# Patient Record
Sex: Male | Born: 1955
Health system: Southern US, Community
[De-identification: ages and names within clinical notes are randomized; demographics above are authoritative.]

## PROBLEM LIST (undated history)

## (undated) DIAGNOSIS — I2510 Atherosclerotic heart disease of native coronary artery without angina pectoris: Secondary | ICD-10-CM

## (undated) DIAGNOSIS — I639 Cerebral infarction, unspecified: Secondary | ICD-10-CM

## (undated) DIAGNOSIS — I509 Heart failure, unspecified: Secondary | ICD-10-CM

---

## 2015-10-18 ENCOUNTER — Encounter (HOSPITAL_COMMUNITY): Payer: Self-pay | Admitting: Emergency Medicine

## 2015-10-18 DIAGNOSIS — R531 Weakness: Secondary | ICD-10-CM | POA: Insufficient documentation

## 2015-10-18 DIAGNOSIS — I509 Heart failure, unspecified: Secondary | ICD-10-CM | POA: Insufficient documentation

## 2015-10-18 DIAGNOSIS — I6349 Cerebral infarction due to embolism of other cerebral artery: Secondary | ICD-10-CM | POA: Diagnosis not present

## 2015-10-18 DIAGNOSIS — Z09 Encounter for follow-up examination after completed treatment for conditions other than malignant neoplasm: Secondary | ICD-10-CM | POA: Diagnosis present

## 2015-10-18 DIAGNOSIS — I2510 Atherosclerotic heart disease of native coronary artery without angina pectoris: Secondary | ICD-10-CM | POA: Diagnosis not present

## 2015-10-18 DIAGNOSIS — F17200 Nicotine dependence, unspecified, uncomplicated: Secondary | ICD-10-CM | POA: Insufficient documentation

## 2015-10-18 DIAGNOSIS — R4789 Other speech disturbances: Secondary | ICD-10-CM | POA: Insufficient documentation

## 2015-10-18 NOTE — ED Notes (Signed)
Pt. was just [facility] [place] [facility] , family request follow -up check up before going home , pt. was hospitalized at [place] for 2 weeks due to stroke .

## 2015-10-19 ENCOUNTER — Emergency Department (HOSPITAL_COMMUNITY)
Admission: EM | Admit: 2015-10-19 | Discharge: 2015-10-19 | Disposition: A | Discharge status: Home / Self Care | Payer: Medicaid Other | Attending: Emergency Medicine | Admitting: Emergency Medicine

## 2015-10-19 DIAGNOSIS — I6350 Cerebral infarction due to unspecified occlusion or stenosis of unspecified cerebral artery: Secondary | ICD-10-CM

## 2015-10-19 HISTORY — DX: Cerebral infarction, unspecified: I63.9

## 2015-10-19 HISTORY — DX: Heart failure, unspecified: I50.9

## 2015-10-19 HISTORY — DX: Atherosclerotic heart disease of native coronary artery without angina pectoris: I25.10

## 2015-10-19 LAB — COMPREHENSIVE METABOLIC PANEL
ALBUMIN: 2.9 g/dL — AB (ref 3.5–5.0)
ALT: 38 U/L (ref 17–63)
ANION GAP: 9 (ref 5–15)
AST: 45 U/L — AB (ref 15–41)
Alkaline Phosphatase: 128 U/L — ABNORMAL HIGH (ref 38–126)
BILIRUBIN TOTAL: 0.6 mg/dL (ref 0.3–1.2)
BUN: 22 mg/dL — AB (ref 6–20)
CHLORIDE: 105 mmol/L (ref 101–111)
CO2: 25 mmol/L (ref 22–32)
Calcium: 9.6 mg/dL (ref 8.9–10.3)
Creatinine, Ser: 1.41 mg/dL — ABNORMAL HIGH (ref 0.61–1.24)
GFR calc Af Amer: >60 mL/min (ref >60)
GFR calc non Af Amer: 54 mL/min — ABNORMAL LOW (ref >60)
GLUCOSE: 146 mg/dL — AB (ref 65–99)
POTASSIUM: 3.8 mmol/L (ref 3.5–5.1)
SODIUM: 139 mmol/L (ref 135–145)
Total Protein: 8.7 g/dL — ABNORMAL HIGH (ref 6.5–8.1)

## 2015-10-19 LAB — CBC WITH DIFFERENTIAL/PLATELET
BASOS PCT: 0 %
Basophils Absolute: 0 10*3/uL (ref 0.0–0.1)
EOS ABS: 0.5 10*3/uL (ref 0.0–0.7)
EOS PCT: 5 %
HCT: 40.2 % (ref 39.0–52.0)
HEMOGLOBIN: 13.1 g/dL (ref 13.0–17.0)
Lymphocytes Relative: 20 %
Lymphs Abs: 2.1 10*3/uL (ref 0.7–4.0)
MCH: 30.7 pg (ref 26.0–34.0)
MCHC: 32.6 g/dL (ref 30.0–36.0)
MCV: 94.1 fL (ref 78.0–100.0)
MONOS PCT: 7 %
Monocytes Absolute: 0.8 10*3/uL (ref 0.1–1.0)
NEUTROS PCT: 68 %
Neutro Abs: 7.5 10*3/uL (ref 1.7–7.7)
PLATELETS: 478 10*3/uL — AB (ref 150–400)
RBC: 4.27 MIL/uL (ref 4.22–5.81)
RDW: 13.4 % (ref 11.5–15.5)
WBC: 10.9 10*3/uL — ABNORMAL HIGH (ref 4.0–10.5)

## 2015-10-19 NOTE — ED Provider Notes (Signed)
CSN: [id number]     Arrival date & time 10/18/15  2312 History  By signing my name below, I, Doreatha Martin, attest that this documentation has been prepared under the direction and in the presence of Francisco Hong, MD. Electronically Signed: Doreatha Martin, ED Scribe. 10/19/2015. 12:40 AM.    Chief Complaint  Patient presents with  . Follow-up   The history is provided by the patient and a relative. No language interpreter was used.    HPI Comments: Francisco Obrien is a 59 y.o. male with h/o stroke, CAD, CHF, 2 cardiac stents who presents to the Emergency Department requesting a follow up s/p discharge from [facility] in [place] today. Pt was hospitalized for 2 weeks due to a stroke. Pt was in [place] visiting family. Son states he was hospitalized for CHF for 4 days, went home and was readmitted the next day for a non bleeding stroke. He presented with right sided weakness and dysarthria. Pt was discharged on blood thinners. Son notes the pt was completely baseline before the stroke. Son states that he was told the pts condition should continue to improve with physical therapy. Pt is not insured due to a recent move to [place] a few months ago. Family denies any changes in mental status or condition since discharge from the hospital.   Past Medical History  Diagnosis Date  . Stroke (HCC)   . Coronary artery disease   . CHF (congestive heart failure) (HCC)    No past surgical history on file. No family history on file. Social History  Substance Use Topics  . Smoking status: Current Every Day Smoker  . Smokeless tobacco: Not on file  . Alcohol Use: Yes    Review of Systems  Neurological: Positive for speech difficulty ( s/p stroke) and weakness ( right sided weakness s/p stroke).  All other systems reviewed and are negative.  Allergies  Review of patient's allergies indicates no known allergies.  Home Medications   Prior to Admission medications   Not on File   BP 106/81 mmHg  Pulse  70  Temp(Src) 97.3 F (36.3 C) (Oral)  Resp 16  SpO2 100% Physical Exam  Constitutional: He appears well-developed and well-nourished. No distress.  HENT:  Head: Normocephalic and atraumatic.  Mouth/Throat: Oropharynx is clear and moist. No oropharyngeal exudate.  Eyes: Conjunctivae and EOM are normal. Pupils are equal, round, and reactive to light. Right eye exhibits no discharge. Left eye exhibits no discharge. No scleral icterus.  Neck: Normal range of motion. Neck supple. No JVD present. No thyromegaly present.  Cardiovascular: Normal rate, regular rhythm, normal heart sounds and intact distal pulses.  Exam reveals no gallop and no friction rub.   No murmur heard. There is an S4  Pulmonary/Chest: Effort normal and breath sounds normal. No respiratory distress. He has no wheezes. He has no rales.  Abdominal: Soft. Bowel sounds are normal. He exhibits no distension and no mass. There is no tenderness.  Musculoskeletal: Normal range of motion. He exhibits no edema or tenderness.  Lymphadenopathy:    He has no cervical adenopathy.  Neurological: He is alert. Coordination normal.  Dense right sided weakness of arm and leg. Expressive aphasia.   Skin: Skin is warm and dry. No rash noted. He is not diaphoretic. No erythema.  Psychiatric: He has a normal mood and affect. His behavior is normal.  Nursing note and vitals reviewed.  ED Course  Procedures (including critical care time) DIAGNOSTIC STUDIES: Oxygen Saturation is 100% on RA,  normal by my interpretation.    COORDINATION OF CARE: 12:38 AM Discussed treatment plan with pt at bedside and pt agreed to plan.   Labs Review Labs Reviewed  CBC WITH DIFFERENTIAL/PLATELET - Abnormal; Notable for the following:    WBC 10.9 (*)    Platelets 478 (*)    All other components within normal limits  COMPREHENSIVE METABOLIC PANEL - Abnormal; Notable for the following:    Glucose, Bld 146 (*)    BUN 22 (*)    Creatinine, Ser 1.41 (*)     Total Protein 8.7 (*)    Albumin 2.9 (*)    AST 45 (*)    Alkaline Phosphatase 128 (*)    GFR calc non Af Amer 54 (*)    All other components within normal limits   I have personally reviewed and evaluated these lab results as part of my medical decision-making.  MDM   Final diagnoses:  Cerebrovascular accident (CVA) due to occlusion of cerebral artery (HCC)    The pt has a recent dense stroke - he is currently living with family who has recently moved her and he has now moved in with them - the pt has no resources in the community and no health insurance - the son and his significant other will care for Francisco Obrien until Monday and I have ordered a face to face consult for him with SW and PT / OT etc.  They are in agreement with the plan - I have written a Rx for a bedside commode.  Meds given in ED:  Medications - No data to display  There are no discharge medications for this patient.   I personally performed the services described in this documentation, which was scribed in my presence. The recorded information has been reviewed and is accurate.       Francisco HongBrian Cesareo Vickrey, MD 10/20/15 [phone]

## 2015-10-19 NOTE — ED Notes (Signed)
Pt had an unwitnessed fall. Housekeeper walking by found pt lying on floor. Pt unsure why he fell or where he was going. Fall risk in place. Pt pending discharge. Pt denies pain or injury. Charge and EDP made aware

## 2015-10-19 NOTE — Discharge Instructions (Signed)
°Emergency Department Resource Guide °1) Find a Doctor and Pay Out of Pocket °Although you won't have to find out who is covered by your insurance plan, it is a good idea to ask around and get recommendations. You will then need to call the office and see if the doctor you have chosen will accept you as a new patient and what types of options they offer for patients who are self-pay. Some doctors offer discounts or will set up payment plans for their patients who do not have insurance, but you will need to ask so you aren't surprised when you get to your appointment. ° °2) Contact Your Local Health Department °Not all health departments have doctors that can see patients for sick visits, but many do, so it is worth a call to see if yours does. If you don't know where your local health department is, you can check in your phone book. The CDC also has a tool to help you locate your state's health department, and many state websites also have listings of all of their local health departments. ° °3) Find a Walk-in Clinic °If your illness is not likely to be very severe or complicated, you may want to try a walk in clinic. These are popping up all over the country in pharmacies, drugstores, and shopping centers. They're usually staffed by nurse practitioners or physician assistants that have been trained to treat common illnesses and complaints. They're usually fairly quick and inexpensive. However, if you have serious medical issues or chronic medical problems, these are probably not your best option. ° °No Primary Care Doctor: °- Call Health Connect at  832-8000 - they can help you locate a primary care doctor that  accepts your insurance, provides certain services, etc. °- Physician Referral Service- 1-800-533-3463 ° °Chronic Pain Problems: °Organization         Address  Phone   Notes  °[facility]  (336) 297-2271 Patients need to be referred by their primary care doctor.  ° °Medication  Assistance: °Organization         Address  Phone   Notes  °[place] [facility] [address] °[place], [place] [address] (336) 641-8030 --Must be a resident of [place] °-- Must have NO insurance coverage whatsoever (no Medicaid/ Medicare, etc.) °-- The pt. MUST have a primary care doctor that directs their care regularly and follows them in the community °  °[facility]  (866) 331-1348   °[employer]  (888) 892-1162   ° °Agencies that provide inexpensive medical care: °Organization         Address  Phone   Notes  °[facility]  (336) 832-8035   °[facility]    (336) 832-7272   °[facility] [address] °[place], [place] [address] (336) 832-4777   °[facility] [address], °[place] (336) 271-4999   °[facility]    (336) 373-0678   °[facility]    (336) 272-1050   °[facility] ° [address], [place] Phone:  (336) 832-4444, Fax:  (336) 832-4440 Hours of Operation:  9 am - 6 pm, M-F.  Also accepts Medicaid/Medicare and self-pay.  °[facility] ° [address],  Phone: (336) 832-3150, Fax: (336) 832-3151. Hours of Operation:  8:30 am - 5:30 pm, M-F.  Also accepts Medicaid and self-pay.  °[facility] [place] [address], [place] Phone: (336) 878-6027   °[facility] [address], [place], [place] (336)723-1848, Ext. 123 Mondays & Thursdays: 7-9 AM.  First 15 patients are seen on a first come, first serve basis. °  ° °Medicaid-accepting [place] Providers: ° °Organization         Address  Phone   Notes  °[facility] [address], [place] (336) 641-2100 Also accepts self-pay patients.  °[facility] [address], [place] ° (336) 856-9996   °[facility] [address], [place]  (336) 288-8857   °[facility] [address] [place] [address], [place] (336) 299-7000   °Veita Bland [address], [place]  ° (336) 373-1557 Only accepts [place] Access Medicaid patients after they have their name applied to their card.  ° °Self-Pay (no insurance) in [place]: ° °Organization         Address  Phone   Notes  °Sickle Cell Patients, [facility] [address], [place] (336) 832-1970   °[facility] [address], [place] (336) 832-4400   °[facility] [place] ° [address],  (336) 992-4800   °[facility]/Dr. Osei-Bonsu ° [address] [place] [address], [place] or [address], [place] (336) 841-8500 Phone number for both [place] and [place] locations is the same.  °[facility] [address], [place] (336) 299-0000   °[facility] [address] [place] [address], [place] or [address] (336) 852-7530 °(336) 878-2260   °[facility] [address], [place] (336) 350-1642, phone; (336) 294-5005, fax Sees patients 1st and 3rd Saturday of every month.  Must not qualify for public or private insurance (i.e. Medicaid, Medicare, [place] Health Choice, Veterans' Benefits) • Household income should be no more than 200% of the poverty level •The clinic cannot treat you if you are pregnant or think you are pregnant • Sexually transmitted diseases are not treated at the clinic.  ° ° °Dental Care: °Organization         Address  Phone  Notes  °[place] [facility] [address], [place] (336) 641-6152 Accepts children up to age 21 who are enrolled in Medicaid or [place] Health Choice; pregnant women with a Medicaid card; and children who have applied for Medicaid or [place] Health Choice, but were declined, whose parents can pay a reduced fee at time of service.  °[place]  [facility] [place]  [address], [place] (336) 641-7733 Accepts children up to age 21 who are enrolled in Medicaid or [place] Health Choice; pregnant women with a Medicaid card; and children who have applied for Medicaid or [place] Health Choice, but were declined, whose parents can pay a reduced fee at time of service.  °[facility] ° [address], [place] (336) 641-4533 Patients are seen by appointment only. Walk-ins are not accepted. [facility] will see patients 18 years of age and older. °Monday - Tuesday (8am-5pm) °Most Wednesdays (8:30-5pm) °$30 per visit, cash only  °[facility] ° [address], [place] (336) 641-4533 Patients are seen by appointment only. Walk-ins are not accepted. [facility] will see patients 18 years of age and older. °One   Wednesday Evening (Monthly: Volunteer Based).  $30 per visit, cash only  °[facility]  (919) 537-3737 for adults; Children under age 4, call Graduate Pediatric Dentistry at (919) 537-3956. Children aged 4-14, please call (919) 537-3737 to request a pediatric application. ° Dental services are provided in all areas of dental care including fillings, crowns and bridges, complete and partial dentures, implants, gum treatment, root canals, and extractions. Preventive care is also provided. Treatment is provided to both adults and children. °Patients are selected via a lottery and there is often a waiting list. °  °[facility] [address], °[place] ° (336) 763-8833 [email or web address] °  °[facility] [address], [place], [place] (336)723-1848, Ext. 123 Second and Fourth Thursday of each month, opens at 6:30 AM; Clinic ends at 9 AM.  Patients are seen on a first-come first-served basis, and a limited number are seen during each clinic.  ° °[facility] ° [address], [place], [place] (336) 723-7904    Eligibility Requirements °You must have lived in [place], [place], or [place] counties for at least the last three months. °  You cannot be eligible for state or federal sponsored healthcare insurance, including Veterans Administration, Medicaid, or Medicare. °  You generally cannot be eligible for healthcare insurance through your employer.  °  How to apply: °Eligibility screenings are held every Tuesday and Wednesday afternoon from 1:00 pm until 4:00 pm. You do not need an appointment for the interview!  °[facility] [address], [place], [place] 336-631-2330   °[place] [facility]  336-342-8273   °[facility]  336-703-3100   °[facility]  336-570-6415   ° °Behavioral Health Resources in the Community: °Intensive Outpatient Programs °Organization         Address  Phone  Notes  °[facility] [address], [place], [place] 336-878-6098   °[facility] [address], [place], [place] 336-832-9800   °[facility] [address], [place], [place] ° 336-882-2125   °[facility] [address],  °[place], [place] 1-800-853-5163 or 336-641-4981   °Substance Abuse Resources °Organization         Address  Phone  Notes  °[facility]  336-882-2125   °[facility]  336-784-9470   °[facility]  336-285-9073   °[facility]  336-845-3988   °Residential & Outpatient Substance Abuse Program  1-800-659-3381   °Psychological Services °Organization         Address  Phone  Notes  °[facility]  336- 832-9600   °[facility]  336- 378-7881   °[facility] [address], [place] 1-800-853-5163 or 336-641-4981   ° °Mobile Crisis Teams °Organization         Address  Phone  Notes  °[facility], Mobile Crisis Care Unit  1-877-626-1772   °Assertive °[facility] ° [address]  [place], [place] 336-834-9664   °Sharon DeEsch [address] °[place] 336-554-5454   ° °Self-Help/Support Groups °Organization         Address  Phone             Notes  °[facility] - variety of support groups  336- 373-1402 Call for more information  °Narcotics Anonymous (NA), [facility] [address], °[place]   2 meetings at this location  ° °  Residential Treatment Programs °Organization         Address  Phone  Notes  °[facility] [address],    °[place]  1-866-801-8205   °[facility] ° [address], [place], [place] 704-293-8524   °[facility] [address], [place] 336-845-3988 Admissions: 8am-3pm M-F  °[facility] [address]    °[place], [place] 336-841-1104   °[facility] [address], [place], [place] 336-379-7146   °[facility] [address]  °[place], [place] 336-285-9073   °[facility] Intensive Outpatient [address], [place], [place] 336-852-3033   °[facility] [address]  °[place], [place] 1-877-615-2722 or 336-784-9470   °[facility] [address] [place], [place] 336-227-7417 Accepts Medicaid  °[facility] [address]  °[place] 1-800-659-3381 Substance Abuse/Addiction Treatment  ° °[place] Behavioral Health Resources °Organization         Address  Phone  Notes  °[facility]  (888) 581-9988   °Julie Brannon, PhD [address] [place], [place]   (336) 349-5553 or (336) 951-0000   °[facility]   [address] °[place], [place] (336) 349-4454   °[facility] [address], [place], [place] (336) 342-8316 Insurance/Medicaid/sponsorship through [facility]  °[facility] [address]                                    [place], [place] (336) 342-8316 Therapy/tele-psych/case    °[facility] [address]  ° [place], [place] (336) 349-2233    °Dr. Arfeen  (336) 349-4544   °[facility] [place]  [employer] [place] [facility] 1) [address],  °2) [address], [place] °3)  [address], [place] (336) 349-3220 °(336) 342-7768 ° °(336) 342-8140   °[place] Child Abuse Hotline (336) 342-1394 or (336) 342-3537 (After Hours)    ° ° °

## 2015-10-19 NOTE — ED Notes (Signed)
Pt unable to e-sign, pt verbalized understanding DC instructions and prescription for bedside commode.

## 2015-10-19 NOTE — ED Notes (Signed)
Upon entering room to DC pt, was being pulled up in bed by GrenadaBrittany RN, Manuela Schwartzuth RN, Anette RiedelNoah EMT. Stated that pt had been found on floor, EDP was aware before DC.

## 2015-10-22 ENCOUNTER — Emergency Department (HOSPITAL_COMMUNITY)
Admission: EM | Admit: 2015-10-22 | Discharge: 2015-10-26 | Disposition: A | Discharge status: Home / Self Care | Payer: Medicaid Other | Attending: Emergency Medicine | Admitting: Emergency Medicine

## 2015-10-22 ENCOUNTER — Emergency Department (HOSPITAL_COMMUNITY): Payer: Medicaid Other

## 2015-10-22 ENCOUNTER — Encounter (HOSPITAL_COMMUNITY): Payer: Self-pay | Admitting: Emergency Medicine

## 2015-10-22 DIAGNOSIS — I2510 Atherosclerotic heart disease of native coronary artery without angina pectoris: Secondary | ICD-10-CM | POA: Insufficient documentation

## 2015-10-22 DIAGNOSIS — F17200 Nicotine dependence, unspecified, uncomplicated: Secondary | ICD-10-CM | POA: Insufficient documentation

## 2015-10-22 DIAGNOSIS — I639 Cerebral infarction, unspecified: Secondary | ICD-10-CM | POA: Insufficient documentation

## 2015-10-22 DIAGNOSIS — I509 Heart failure, unspecified: Secondary | ICD-10-CM | POA: Insufficient documentation

## 2015-10-22 LAB — PROTIME-INR
INR: 1.06 (ref 0.00–1.49)
Prothrombin Time: 14 seconds (ref 11.6–15.2)

## 2015-10-22 LAB — COMPREHENSIVE METABOLIC PANEL
ALBUMIN: 3 g/dL — AB (ref 3.5–5.0)
ALK PHOS: 118 U/L (ref 38–126)
ALT: 34 U/L (ref 17–63)
AST: 55 U/L — AB (ref 15–41)
Anion gap: 6 (ref 5–15)
BUN: 21 mg/dL — AB (ref 6–20)
CALCIUM: 9.3 mg/dL (ref 8.9–10.3)
CO2: 24 mmol/L (ref 22–32)
CREATININE: 1.35 mg/dL — AB (ref 0.61–1.24)
Chloride: 107 mmol/L (ref 101–111)
GFR calc Af Amer: >60 mL/min (ref >60)
GFR, EST NON AFRICAN AMERICAN: 57 mL/min — AB (ref >60)
GLUCOSE: 98 mg/dL (ref 65–99)
Potassium: 5.5 mmol/L — ABNORMAL HIGH (ref 3.5–5.1)
Sodium: 137 mmol/L (ref 135–145)
TOTAL PROTEIN: 7.8 g/dL (ref 6.5–8.1)
Total Bilirubin: 1 mg/dL (ref 0.3–1.2)

## 2015-10-22 LAB — CBC
HEMATOCRIT: 40 % (ref 39.0–52.0)
HEMOGLOBIN: 13.3 g/dL (ref 13.0–17.0)
MCH: 31.4 pg (ref 26.0–34.0)
MCHC: 33.3 g/dL (ref 30.0–36.0)
MCV: 94.6 fL (ref 78.0–100.0)
Platelets: 418 10*3/uL — ABNORMAL HIGH (ref 150–400)
RBC: 4.23 MIL/uL (ref 4.22–5.81)
RDW: 13.5 % (ref 11.5–15.5)
WBC: 11 10*3/uL — AB (ref 4.0–10.5)

## 2015-10-22 LAB — I-STAT TROPONIN, ED: TROPONIN I, POC: 0.04 ng/mL (ref 0.00–0.08)

## 2015-10-22 LAB — APTT: aPTT: 30 seconds (ref 24–37)

## 2015-10-22 MED ORDER — HYDRALAZINE HCL 25 MG PO TABS
25.0000 mg | ORAL_TABLET | Freq: Three times a day (TID) | ORAL | Status: DC
  Administered 2015-10-22 – 2015-10-26 (×7): 25 mg via ORAL
  Filled 2015-10-22 (×18): qty 1

## 2015-10-22 MED ORDER — ASPIRIN 81 MG PO CHEW
324.0000 mg | CHEWABLE_TABLET | Freq: Once | ORAL | Status: AC
  Administered 2015-10-22: 324 mg via ORAL
  Filled 2015-10-22: qty 4

## 2015-10-22 MED ORDER — HEPARIN SODIUM (PORCINE) 5000 UNIT/ML IJ SOLN: 60.0000 [IU]/kg | INTRAMUSCULAR | Status: DC

## 2015-10-22 MED ORDER — ASPIRIN EC 81 MG PO TBEC
81.0000 mg | DELAYED_RELEASE_TABLET | Freq: Every day | ORAL | Status: DC
  Administered 2015-10-22 – 2015-10-26 (×5): 81 mg via ORAL
  Filled 2015-10-22 (×5): qty 1

## 2015-10-22 MED ORDER — PANTOPRAZOLE SODIUM 40 MG PO TBEC
40.0000 mg | DELAYED_RELEASE_TABLET | Freq: Every day | ORAL | Status: DC
  Administered 2015-10-22 – 2015-10-26 (×5): 40 mg via ORAL
  Filled 2015-10-22 (×5): qty 1

## 2015-10-22 MED ORDER — FUROSEMIDE 20 MG PO TABS
40.0000 mg | ORAL_TABLET | Freq: Every day | ORAL | Status: DC
  Administered 2015-10-22 – 2015-10-26 (×5): 40 mg via ORAL
  Filled 2015-10-22 (×5): qty 2

## 2015-10-22 MED ORDER — TAMSULOSIN HCL 0.4 MG PO CAPS
0.4000 mg | ORAL_CAPSULE | Freq: Every day | ORAL | Status: DC
  Administered 2015-10-22 – 2015-10-26 (×5): 0.4 mg via ORAL
  Filled 2015-10-22 (×6): qty 1

## 2015-10-22 MED ORDER — ATORVASTATIN CALCIUM 40 MG PO TABS
40.0000 mg | ORAL_TABLET | Freq: Every day | ORAL | Status: DC
  Administered 2015-10-22: 40 mg via ORAL
  Filled 2015-10-22 (×5): qty 1

## 2015-10-22 MED ORDER — CARVEDILOL 6.25 MG PO TABS
6.2500 mg | ORAL_TABLET | Freq: Two times a day (BID) | ORAL | Status: DC
  Administered 2015-10-23: 6.25 mg via ORAL
  Filled 2015-10-22 (×4): qty 1

## 2015-10-22 MED ORDER — AMLODIPINE BESYLATE 5 MG PO TABS
10.0000 mg | ORAL_TABLET | Freq: Every day | ORAL | Status: DC
  Administered 2015-10-22 – 2015-10-26 (×5): 10 mg via ORAL
  Filled 2015-10-22 (×6): qty 2

## 2015-10-22 MED ORDER — HEPARIN SODIUM (PORCINE) 5000 UNIT/ML IJ SOLN
60.0000 [IU]/kg | INTRAMUSCULAR | Status: DC
  Filled 2015-10-22: qty 1

## 2015-10-22 MED ORDER — CLOPIDOGREL BISULFATE 75 MG PO TABS
75.0000 mg | ORAL_TABLET | Freq: Every day | ORAL | Status: DC
  Administered 2015-10-22 – 2015-10-26 (×5): 75 mg via ORAL
  Filled 2015-10-22 (×5): qty 1

## 2015-10-22 MED ORDER — SODIUM CHLORIDE 0.9 % IV SOLN
INTRAVENOUS | Status: DC
  Administered 2015-10-22 – 2015-10-23 (×2): via INTRAVENOUS

## 2015-10-22 NOTE — Care Management (Signed)
CM [facility] with [facility], [facility]  CM discussed with patient, he has dysarthria, CM has difficulty understanding patient. CM attempted to contact son Quran with discharge plan, no answer left message on VM concerning discharge. CM discussed with ED CSW.  We will continue to locate family to discharge patient home with [facility]AR.

## 2015-10-22 NOTE — Progress Notes (Signed)
[employer] dispatched to pt's home to make contact with his son.

## 2015-10-22 NOTE — Care Management Note (Signed)
Case Management Note  Patient Details  Name: Francisco Obrien MRN: [id number] Date of Birth: 01-Jan-1956  Subjective/Objective:                  59 y.o. male with h/o stroke, CAD, CHF, 2 cardiac stents who presents to the Emergency Department requesting a follow up s/p stroke. Pt was hospitalized for 2 weeks due to a stroke; [place] family who feels they can no longer care for pt in home due to weakness.// Home with family.  Action/Plan: Follow for disposition needs.   Expected Discharge Date:       10/22/15           Expected Discharge Plan:  Skilled Nursing Facility  In-House Referral:  Clinical Social Work  Discharge planning Services  CM Consult  Post Acute Care Choice:    Choice offered to:  Patient, Adult Children  DME Arranged:    DME Agency:     HH Arranged:    HH Agency:     Status of Service:  In process, will continue to follow  Medicare Important Message Given:    Date Medicare IM Given:    Medicare IM give by:    Date Additional Medicare IM Given:    Additional Medicare Important Message give by:     If discussed at Long Length of Stay Meetings, dates discussed:    Additional Comments: NCM  And Social Work consulted regarding disposition needs :HH vs SNF placement.  RN talked with family who states they can no longer care for pt in home- SNF placement optimal. Oletta CohnWood, Medina Degraffenreid, RN 10/22/2015, 2:22 PM

## 2015-10-22 NOTE — ED Notes (Signed)
zoll pads in place.  

## 2015-10-22 NOTE — ED Notes (Signed)
SW at the bedside.

## 2015-10-22 NOTE — Discharge Planning (Signed)
NCM contacted [facility] to see if pt would qualify for indigent services.  [facility] will review pt.

## 2015-10-22 NOTE — ED Notes (Signed)
[facility] Per EMS, pt was at home and started having SOB around 0030. EMS arrived and room air sats were at 88%, increasing to 94% on 4L. Pt had a CVA last Tuesday and was discharged this past weekend. Pt has baseline right side deficits. Last BP was 140/100, HR 78. EMS placed a 20g in the left hand.

## 2015-10-22 NOTE — ED Provider Notes (Signed)
CSN: [id number]     Arrival date & time 10/22/15  0130 History   By signing my name below, I, Freida Busman, attest that this documentation has been prepared under the direction and in the presence of Tomasita Crumble, MD . Electronically Signed: Freida Busman, Scribe. 10/22/2015. 2:43 AM.   Chief Complaint  Patient presents with  . Shortness of Breath   LEVEL 5 CAVEAT DUE TO residual slurred speech due to stroke   The history is provided by the patient. No language interpreter was used.    HPI Comments:  Francisco Obrien is a 59 y.o. male with a history of CVA, CAD, and CHF, who presents to the Emergency Department via EMS complaining of SOB. He denies use of home O2. He also denies pain at this time and cough. Pt was evaluated in the ED on 10/19/2015 for unwitnessed fall following CVA 2 weeks prior. He was admitted in [place] for his CVA, he is in [place] visiting family. Pt has had baseline right sided deficits since.  Past Medical History  Diagnosis Date  . Stroke (HCC)   . Coronary artery disease   . CHF (congestive heart failure) (HCC)    History reviewed. No pertinent past surgical history. No family history on file. Social History  Substance Use Topics  . Smoking status: Current Every Day Smoker  . Smokeless tobacco: None  . Alcohol Use: Yes    Review of Systems  Unable to perform ROS: Other (Slurred speech )    Allergies  Review of patient's allergies indicates no known allergies.  Home Medications   Prior to Admission medications   Not on File   BP 127/92 mmHg  Pulse 78  Temp(Src) 97.4 F (36.3 C) (Oral)  Resp 14  Ht  (1.803 m)  Wt 140 lb (63.504 kg)  BMI 19.53 kg/m2  SpO2 96% Physical Exam  Constitutional: He is oriented to person, place, and time. Vital signs are normal. He appears well-developed.  Non-toxic appearance. He does not appear ill. No distress.  HENT:  Head: Normocephalic and atraumatic.  Nose: Nose normal.  Mouth/Throat: Oropharynx is clear and  moist. No oropharyngeal exudate.   in place   Eyes: Conjunctivae and EOM are normal. Pupils are equal, round, and reactive to light. No scleral icterus.  Neck: Normal range of motion. Neck supple. No tracheal deviation, no edema, no erythema and normal range of motion present. No thyroid mass and no thyromegaly present.  Cardiovascular: Normal rate, regular rhythm, S1 normal, S2 normal, normal heart sounds, intact distal pulses and normal pulses.  Exam reveals no gallop and no friction rub.   No murmur heard. Pulmonary/Chest: Effort normal and breath sounds normal. No respiratory distress. He has no wheezes. He has no rhonchi. He has no rales.  Abdominal: Soft. Normal appearance and bowel sounds are normal. He exhibits no distension, no ascites and no mass. There is no hepatosplenomegaly. There is no tenderness. There is no rebound, no guarding and no CVA tenderness.  Musculoskeletal: Normal range of motion. He exhibits no edema or tenderness.  Lymphadenopathy:    He has no cervical adenopathy.  Neurological: He is alert and oriented to person, place, and time. He has normal strength. No cranial nerve deficit or sensory deficit.  0/5strength  RUE and RLE  5/5 strength LLE and LUE Slurred speech  Skin: Skin is warm, dry and intact. No petechiae and no rash noted. He is not diaphoretic. No erythema. No pallor.  Psychiatric: He has a normal  mood and affect. His behavior is normal. Judgment normal.  Nursing note and vitals reviewed.   ED Course  Procedures   DIAGNOSTIC STUDIES:  Oxygen Saturation is 100% on 4L [place], normal by my interpretation.     Labs Review Labs Reviewed  CBC - Abnormal; Notable for the following:    WBC 11.0 (*)    Platelets 418 (*)    All other components within normal limits  COMPREHENSIVE METABOLIC PANEL - Abnormal; Notable for the following:    Potassium 5.5 (*)    BUN 21 (*)    Creatinine, Ser 1.35 (*)    Albumin 3.0 (*)    AST 55 (*)    GFR calc non Af  Amer 57 (*)    All other components within normal limits  APTT  PROTIME-INR  Rosezena SensorI-STAT TROPOININ, ED    Imaging Review Dg Chest Port 1 View  10/22/2015  CLINICAL DATA:  59 year old male with shortness of breath EXAM: PORTABLE CHEST 1 VIEW COMPARISON:  None. FINDINGS: Single portable view of the chest demonstrate clear lungs with sharp costophrenic angles. No pneumothorax but top-normal cardiac silhouette. The osseous structures appear unremarkable. IMPRESSION: No active disease. Electronically Signed   By: Elgie CollardArash  Radparvar M.D.   On: 10/22/2015 02:57   I have personally reviewed and evaluated these images and lab results as part of my medical decision-making.   EKG Interpretation   Date/Time:  Tuesday October 22 2015 01:37:50 EST Ventricular Rate:  87 PR Interval:  128 QRS Duration: 111 QT Interval:  407 QTC Calculation: 490 R Axis:   84 Text Interpretation:  Sinus rhythm STD infeior leads STE in V2-V4 No old  tracing to compare Confirmed by Erroll Lunani, Jhonny Calixto Ayokunle [phone]([id number]) on 10/22/2015  2:32:06 AM      MDM   Final diagnoses:  None   Patient presents to the emergency department for evaluation of shortness of breath. He is unable to give a solid history due to his slurred speech from stroke. He denies chest pain to me however his EKG is concerning for possible STEMI. His ST elevations in V2 through V4 and ST depressions in the inferior leads. Code STEMI was activated, the fellow is evaluated the patient, Denyse Dagomer code STEMI has been canceled. Her troponin is negative as well. I spoke with the son who came to the room, he states it isn't overwhelming trying to help the patient after the stroke. He is looking for social work consult for rehabilitation.  Will retain the patient to the morning time worse as work and be consulted for disposition.    I personally performed the services described in this documentation, which was scribed in my presence. The recorded information has been  reviewed and is accurate.      Tomasita CrumbleAdeleke Tiaja Hagan, MD 10/22/15 [phone]

## 2015-10-22 NOTE — Progress Notes (Signed)
CSW has attempted to contact pt's son re: pt's d/c.  Await return call.

## 2015-10-22 NOTE — Progress Notes (Addendum)
Spoke with pt's son via phone.  Explained that pt needs to have means to pay for SNF care or apply for Medicaid/disability.  [facility] but is not exactly sure of the status of pt's application.  Son denies applying for Medicaid.  Pt's son encouraged to f/u with Medicaid ASAP so that he could be placed in a facility. [facility]

## 2015-10-22 NOTE — ED Notes (Signed)
Received pt from Pod B, pt awaiting Social Work Consult.  Pt is alert, oriented with expressive aphasia noted, R sided weakness - pt reports from previous stroke x 1 week ago.  Son at bedside.  Slip resistant socks and warm blankets provided.

## 2015-10-22 NOTE — ED Notes (Signed)
Pt refusing to have his BP medication at this time and refusing to have home care nurse set up by SW. Burna MortimerWanda, CM to talk to son about pt care and pt to be dc home by PTAR.

## 2015-10-22 NOTE — ED Notes (Signed)
Portable x-ray at the bedside.  

## 2015-10-22 NOTE — ED Notes (Signed)
Code stemi cancelled by cardiologist. Dr. Malena CatholicAmbrosia.

## 2015-10-22 NOTE — ED Notes (Signed)
Phlebotomy at the bedside  

## 2015-10-22 NOTE — ED Notes (Addendum)
Please contact son, [name], at following phone numbers w/ updates:  Home - [phone] Cell - [phone]

## 2015-10-23 LAB — I-STAT TROPONIN, ED: Troponin i, poc: 0.05 ng/mL (ref 0.00–0.08)

## 2015-10-23 MED ORDER — ACETAMINOPHEN 325 MG PO TABS: 650.0000 mg | ORAL_TABLET | Freq: Four times a day (QID) | ORAL | Status: DC | PRN

## 2015-10-23 NOTE — ED Notes (Signed)
EKG given to Dr. Bebe ShaggyWickline, due to pulse rate reading in the mid 30's and complaint of shortness of breath

## 2015-10-23 NOTE — ED Notes (Signed)
Pt's son reported to RN that Pt began to c/o chest pain. Obtained ECG, notified EDP, and assessed pt. During assessment pain report seemed transient x2. No ECG changes noted.

## 2015-10-23 NOTE — ED Provider Notes (Signed)
Nurse called and stated pt was bradycardic On my eval in room, HR 80-90s He is awake/alert Will hold his B-blocker  ED ECG REPORT   Date: 10/23/2015 1629  Rate: 76  Rhythm: normal sinus rhythm  QRS Axis: normal  Intervals: normal  ST/T Wave abnormalities: nonspecific ST changes  Conduction Disutrbances:LVH noted  Narrative Interpretation:   Old EKG Reviewed: unchanged  I have personally reviewed the EKG tracing and agree with the computerized printout as noted.   Zadie Rhineonald Paulette [place], MD 10/23/15 [phone]

## 2015-10-23 NOTE — ED Notes (Signed)
Holding Norvasc - POD E pyxis did not have the RX.  Messaged pharmacy to have them send to me.

## 2015-10-23 NOTE — NC FL2 (Signed)
[place] MEDICAID FL2 LEVEL OF CARE SCREENING TOOL     IDENTIFICATION  Patient Name: Francisco Obrien Birthdate: [birth year] Sex: male Admission Date (Current Location): 10/22/2015  [place] and [place] Number: [name] ([place])  ([id number]) Facility and Address:  The [place]. [facility], [address], [place], [place] [address]      Provider Number: [id number]  Attending Physician Name and Address:  Provider Not In System  Relative Name and Phone Number:       Current Level of Care: Hospital Recommended Level of Care: Skilled Nursing Facility Prior Approval Number:    Date Approved/Denied:   PASRR Number:    Discharge Plan: SNF    Current Diagnoses: There are no active problems to display for this patient.   Orientation ACTIVITIES/SOCIAL BLADDER RESPIRATION    Self, Situation, Place  Family supportive Incontinent Normal  BEHAVIORAL SYMPTOMS/MOOD NEUROLOGICAL BOWEL NUTRITION STATUS      Incontinent  (bed side swallow pending)  PHYSICIAN VISITS COMMUNICATION OF NEEDS Height & Weight Skin  30 days Verbally  (180.3 cm) 140 lbs. Normal          AMBULATORY STATUS RESPIRATION    Assist extensive Normal      Personal Care Assistance Level of Assistance  Bathing, Feeding, Dressing Bathing Assistance: Maximum assistance Feeding assistance: Limited assistance Dressing Assistance: Maximum assistance      Functional Limitations Info  Speech     Speech Info: Impaired       SPECIAL CARE FACTORS FREQUENCY  PT (By licensed PT), OT (By licensed OT)                   Additional Factors Info                  Current Medications (10/23/2015):  This is the current hospital active medication list Current Facility-Administered Medications  Medication Dose Route Frequency Provider Last Rate Last Dose  . 0.9 %  sodium chloride infusion   Intravenous Continuous Tomasita Crumble, MD 10 mL/hr at 10/22/15 0234    . amLODipine (NORVASC) tablet 10 mg  10  mg Oral Daily Gwyneth Sprout, MD   10 mg at 10/23/15 1057  . aspirin EC tablet 81 mg  81 mg Oral Daily Gwyneth Sprout, MD   81 mg at 10/23/15 0947  . atorvastatin (LIPITOR) tablet 40 mg  40 mg Oral QHS Gwyneth Sprout, MD   40 mg at 10/22/15 2121  . clopidogrel (PLAVIX) tablet 75 mg  75 mg Oral Daily Gwyneth Sprout, MD   75 mg at 10/23/15 0947  . furosemide (LASIX) tablet 40 mg  40 mg Oral Daily Gwyneth Sprout, MD   40 mg at 10/23/15 0947  . hydrALAZINE (APRESOLINE) tablet 25 mg  25 mg Oral TID Gwyneth Sprout, MD   25 mg at 10/23/15 0947  . pantoprazole (PROTONIX) EC tablet 40 mg  40 mg Oral Daily Gwyneth Sprout, MD   40 mg at 10/23/15 0947  . tamsulosin (FLOMAX) capsule 0.4 mg  0.4 mg Oral Daily Gwyneth Sprout, MD   0.4 mg at 10/23/15 7425   Current Outpatient Prescriptions  Medication Sig Dispense Refill  . amLODipine (NORVASC) 10 MG tablet Take 10 mg by mouth daily.    [name] aspirin 81 MG tablet Take 81 mg by mouth daily.    [name] atorvastatin (LIPITOR) 40 MG tablet Take 40 mg by mouth at bedtime.    . carvedilol (COREG) 6.25 MG tablet Take 6.25 mg by mouth 2 (two) times  daily with a meal.    . clopidogrel (PLAVIX) 75 MG tablet Take 75 mg by mouth daily.    . furosemide (LASIX) 40 MG tablet Take 40 mg by mouth daily.    . hydrALAZINE (APRESOLINE) 25 MG tablet Take 25 mg by mouth 3 (three) times daily.    . pantoprazole (PROTONIX) 40 MG tablet Take 40 mg by mouth daily.    . tamsulosin (FLOMAX) 0.4 MG CAPS capsule Take 0.4 mg by mouth daily.       Discharge Medications: Please see discharge summary for a list of discharge medications.  Relevant Imaging Results:  Relevant Lab Results:  Recent Labs    Additional Information    [name], LCSW

## 2015-10-23 NOTE — ED Notes (Signed)
Sharyl NimrodMeredith, daughter in law, called to check on patient.   She wants social work to call her back at [phone].

## 2015-10-23 NOTE — ED Provider Notes (Signed)
Pt resting comfortably He is watching the today show No distress noted He has obvious right sided paresis He has slurred speech at baseline Awaiting CM/SW input BP 137/88 mmHg  Pulse 88  Temp(Src) 97.8 F (36.6 C) (Oral)  Resp 18  Ht 5\' 11"  (1.803 m)  Wt 63.504 kg  BMI 19.53 kg/m2  SpO2 100%   Zadie Rhineonald Sadler Teschner, MD 10/23/15 1101

## 2015-10-23 NOTE — Discharge Planning (Signed)
NCM called PT/OT, left vm for eval order placed in [facility]L.

## 2015-10-23 NOTE — Discharge Planning (Signed)
NCM received call from pt son [name].  States he was out getting Medicaid for dad; provided temporary Medicaid number of [id number].  NCM will relay to SW department promptly.

## 2015-10-23 NOTE — ED Notes (Signed)
Patient was given a snack and drink. Regular diet order taken for lunch. 

## 2015-10-23 NOTE — ED Provider Notes (Signed)
Patient had chest wall pain. Worse with movement. Patient had troponin with normal EKG was unchanged from prior EKG. Suspect pain is noncardiac. Will treat pain with Tylenol. patient is medically cleared  Bethann BerkshireJoseph Jessah Danser, MD 10/23/15 2234

## 2015-10-23 NOTE — ED Notes (Signed)
Spoke with Marcelino DusterMichelle, CSW, she advised that she would get with case management to try and find out where we are with patient's care and APS.

## 2015-10-23 NOTE — Discharge Planning (Signed)
NCM received return call from daughter-in-law Sharyl Nimrod(Meredith).  NCM called earlier to gather information on Quran's where abouts.  Sharyl NimrodMeredith who does not live in town was in disbelief about us not being able to [employer] Quran; stated she would make a few calls and call me back.

## 2015-10-23 NOTE — ED Notes (Signed)
Spoke with pharmacy, they are sending Norvasc.

## 2015-10-23 NOTE — Progress Notes (Signed)
Discussed pt with his son, Luster LandsbergRenee, who is also an ED pt, and with whom CSW is very familiar.  CSW hopeful to find SNF placement for pt in a timely fashion, as he cannot be safely cared for at home.  Pt's other son, [name], has applied for Medicaid and pt has been issued a temporary number which is helpful.  PT/OT consults are pending.  CSW will continue to follow for disposition.

## 2015-10-23 NOTE — NC FL2 (Signed)
[place] MEDICAID FL2 LEVEL OF CARE SCREENING TOOL     IDENTIFICATION  Patient Name: Francisco Obrien Birthdate: 04/07/1956 Sex: male Admission Date (Current Location): 10/22/2015  County and Medicaid [place]: [place] ([place]  ([id number]) Facility and Address:  The [place], [address], [place], [place] [address]      Provider Number: [id number]  Attending Physician Name and Address:  Provider Not In System  Relative Name and Phone Number:       Current Level of Care: Hospital Recommended Level of Care: Skilled Nursing Facility Prior Approval Number:    Date Approved/Denied:   PASRR Number:    Discharge Plan: SNF    Current Diagnoses: There are no active problems to display for this patient.   Orientation ACTIVITIES/SOCIAL BLADDER RESPIRATION    Self, Situation, Place  Family supportive Incontinent Normal  BEHAVIORAL SYMPTOMS/MOOD NEUROLOGICAL BOWEL NUTRITION STATUS      Incontinent  (bed side swallow pending)  PHYSICIAN VISITS COMMUNICATION OF NEEDS Height & Weight Skin  30 days Verbally 5' 11" (180.3 cm) 140 lbs. Normal          AMBULATORY STATUS RESPIRATION    Assist extensive Normal      Personal Care Assistance Level of Assistance  Bathing, Feeding, Dressing Bathing Assistance: Maximum assistance Feeding assistance: Limited assistance Dressing Assistance: Maximum assistance      Functional Limitations Info  Speech     Speech Info: Impaired       SPECIAL CARE FACTORS FREQUENCY  PT (By licensed PT), OT (By licensed OT)                   Additional Factors Info                  Current Medications (10/23/2015):  This is the current hospital active medication list Current Facility-Administered Medications  Medication Dose Route Frequency Provider Last Rate Last Dose  . 0.9 %  sodium chloride infusion   Intravenous Continuous Adeleke Oni, MD 10 mL/hr at 10/22/15 0234    . amLODipine (NORVASC) tablet 10 mg  10  mg Oral Daily Whitney Plunkett, MD   10 mg at 10/23/15 1057  . aspirin EC tablet 81 mg  81 mg Oral Daily Whitney Plunkett, MD   81 mg at 10/23/15 0947  . atorvastatin (LIPITOR) tablet 40 mg  40 mg Oral QHS Whitney Plunkett, MD   40 mg at 10/22/15 2121  . clopidogrel (PLAVIX) tablet 75 mg  75 mg Oral Daily Whitney Plunkett, MD   75 mg at 10/23/15 0947  . furosemide (LASIX) tablet 40 mg  40 mg Oral Daily Whitney Plunkett, MD   40 mg at 10/23/15 0947  . hydrALAZINE (APRESOLINE) tablet 25 mg  25 mg Oral TID Whitney Plunkett, MD   25 mg at 10/23/15 0947  . pantoprazole (PROTONIX) EC tablet 40 mg  40 mg Oral Daily Whitney Plunkett, MD   40 mg at 10/23/15 0947  . tamsulosin (FLOMAX) capsule 0.4 mg  0.4 mg Oral Daily Whitney Plunkett, MD   0.4 mg at 10/23/15 0947   Current Outpatient Prescriptions  Medication Sig Dispense Refill  . amLODipine (NORVASC) 10 MG tablet Take 10 mg by mouth daily.    . aspirin 81 MG tablet Take 81 mg by mouth daily.    . atorvastatin (LIPITOR) 40 MG tablet Take 40 mg by mouth at bedtime.    . carvedilol (COREG) 6.25 MG tablet Take 6.25 mg by mouth 2 (two) times   daily with a meal.    . clopidogrel (PLAVIX) 75 MG tablet Take 75 mg by mouth daily.    . furosemide (LASIX) 40 MG tablet Take 40 mg by mouth daily.    . hydrALAZINE (APRESOLINE) 25 MG tablet Take 25 mg by mouth 3 (three) times daily.    . pantoprazole (PROTONIX) 40 MG tablet Take 40 mg by mouth daily.    . tamsulosin (FLOMAX) 0.4 MG CAPS capsule Take 0.4 mg by mouth daily.       Discharge Medications: Please see discharge summary for a list of discharge medications.  Relevant Imaging Results:  Relevant Lab Results:  Recent Labs    Additional Information    Lorena Clearman M, LCSW

## 2015-10-23 NOTE — Evaluation (Signed)
Physical Therapy Evaluation Patient Details Name: Francisco Obrien MRN: [id number] DOB: October 17, 1956 Today's Date: 10/23/2015   History of Present Illness  pt presents with CVA ~ 3 wks prior to admit and hx of CAD and CHF.    Clinical Impression  Pt at this time requires A for all aspects of mobility.  Pt appears to have some receptive deficits on top of the Dysarthria mentioned in MD notes as pt needs gestural cueing in order to follow directions.  Pt will often say "yes" or "ok", but truly does not seem to be understanding all verbalizations.  Pt also with R sided inattention, though will track PT to R side, but not stay focused for long.  At this point pt will need SNF level of care and would benefit from receiving therapy at SNF as I feel pt would be able to perform transfers if given the opportunity to work on mobility.  Will continue to follow if remains on acute.      Follow Up Recommendations SNF    Equipment Recommendations  None recommended by PT    Recommendations for Other Services       Precautions / Restrictions Precautions Precautions: Fall Precaution Comments: Global Aphasia? Restrictions Weight Bearing Restrictions: No      Mobility  Bed Mobility Overal bed mobility: Needs Assistance Bed Mobility: Rolling Rolling: Mod assist;Max assist         General bed mobility comments: Rolls towards R side with ModA and pt using L UE to pull on bed rails, but needs Max A to roll to L side.  Gestural cues needed.    Transfers                    Ambulation/Gait                Stairs            Wheelchair Mobility    Modified Rankin (Stroke Patients Only)       Balance Overall balance assessment: Needs assistance                                           Pertinent Vitals/Pain Pain Assessment: Faces Faces Pain Scale: No hurt    Home Living Family/patient expects to be discharged to:: Skilled nursing facility                       Prior Function Level of Independence: Needs assistance   Gait / Transfers Assistance Needed: Unclear if pt has been able to transfer since prior to CVA.             Hand Dominance        Extremity/Trunk Assessment   Upper Extremity Assessment: RUE deficits/detail RUE Deficits / Details: No active movement noted, but does present with increased tone.           Lower Extremity Assessment: RLE deficits/detail RLE Deficits / Details: Overall increased tone, but no active movement noted.      Cervical / Trunk Assessment: Normal  Communication   Communication: Receptive difficulties;Expressive difficulties  Cognition Arousal/Alertness: Awake/alert Behavior During Therapy: WFL for tasks assessed/performed Overall Cognitive Status: Difficult to assess                      General Comments      Exercises  Assessment/Plan    PT Assessment Patient needs continued PT services  PT Diagnosis Hemiplegia dominant side   PT Problem List Decreased strength;Decreased activity tolerance;Decreased balance;Decreased mobility;Decreased coordination;Decreased cognition;Decreased knowledge of use of DME;Decreased safety awareness;Impaired sensation;Impaired tone  PT Treatment Interventions DME instruction;Functional mobility training;Therapeutic activities;Therapeutic exercise;Balance training;Neuromuscular re-education;Cognitive remediation;Patient/family education   PT Goals (Current goals can be found in the Care Plan section) Acute Rehab PT Goals Patient Stated Goal: pt unable to state. PT Goal Formulation: Patient unable to participate in goal setting Time For Goal Achievement: 11/06/15 Potential to Achieve Goals: Fair    Frequency Min 2X/week   Barriers to discharge        Co-evaluation               End of Session   Activity Tolerance: Patient limited by fatigue Patient left: in bed;with call bell/phone within reach Nurse  Communication: Mobility status;Need for lift equipment    Functional Assessment Tool Used: Clinical Judgement Functional Limitation: Mobility: Walking and moving around Mobility: Walking and Moving Around Current Status [phone](G8978): At least 40 percent but less than 60 percent impaired, limited or restricted Mobility: Walking and Moving Around Goal Status [phone](G8979): At least 1 percent but less than 20 percent impaired, limited or restricted    Time: [id number] PT Time Calculation (min) (ACUTE ONLY): 29 min   Charges:   PT Evaluation $Initial PT Evaluation Tier I: 1 Procedure PT Treatments $Therapeutic Activity: 8-22 mins   PT G Codes:   PT G-Codes **NOT FOR INPATIENT CLASS** Functional Assessment Tool Used: Clinical Judgement Functional Limitation: Mobility: Walking and moving around Mobility: Walking and Moving Around Current Status [id number]): At least 40 percent but less than 60 percent impaired, limited or restricted Mobility: Walking and Moving Around Goal Status [phone](G8979): At least 1 percent but less than 20 percent impaired, limited or restricted    Sunny SchleinRitenour, Koty Anctil F, [place] CarolinaPT [phone] 10/23/2015, 2:52 PM

## 2015-10-24 NOTE — Care Management (Signed)
ED CSW actively seeking placement, awaiting a bed offer.

## 2015-10-24 NOTE — ED Notes (Signed)
Pt has removed 4 pulse ox from left hand, 2 prior to my 2 attempts.  Replaced stiff white style with flexible wrap style.  Both were removed after pt shouted "No!"  Pt refused care 3 times.

## 2015-10-24 NOTE — ED Notes (Addendum)
This RN received in report that pt. NPO for difficulty swallowing and eating yesterday. Unable to find note regarding pt. NPO status or reason. Noted that pt. Received all PO doses of meds prior to 12 PM yesterday. This RN and NT completed swallow screen and gave pt. Apple sauce and jello. Pt. Tolerated this with no complaints, no choking, no coughing. Lung sounds clear. Pt. AxO x4 and talking with staff with clear voice.

## 2015-10-24 NOTE — ED Notes (Signed)
Pt tolerating PO fluids and PO pills without any difficulty at this time.

## 2015-10-24 NOTE — Progress Notes (Signed)
CSW continues to pursue placement for pt.  Currently, [facility] is being [facility].  Await bed offer.

## 2015-10-24 NOTE — ED Notes (Signed)
Discussed POC w/ Care Management - plan to find SNF placement for pt today

## 2015-10-24 NOTE — ED Notes (Signed)
Pt still refuses to accept pulse ox or to call when he needs assistance. He says, "No!" to all requests or questions. Pt is consistently uncooperative or compliant.

## 2015-10-24 NOTE — ED Notes (Signed)
This RN went to round on patient, and noticed that patient had thrown his own stool into the floor next to the bed. This RN asked patient why he did not use the call bell for help and he stated, "I don't know why I didn't call for help, I just did it." This RN informed patient to utilize call bell for things like this, and patient stated that he would do as he wanted. This RN cleaned the floor and provided patient with warm, soapy washcloths, rinse basin, and towels so he could work on cleaning himself up.

## 2015-10-24 NOTE — ED Notes (Addendum)
Patient was given a rice crispy treat, jello, and chocolate milk. A soft diet was ordered for lunch.

## 2015-10-24 NOTE — Care Management (Signed)
Pt daughter-in-law Sharyl NimrodMeredith called to check on pt.  States she will be in later.

## 2015-10-25 LAB — CBC WITH DIFFERENTIAL/PLATELET
Basophils Absolute: 0 10*3/uL (ref 0.0–0.1)
Basophils Relative: 0 %
EOS PCT: 7 %
Eosinophils Absolute: 0.7 10*3/uL (ref 0.0–0.7)
HCT: 35.6 % — ABNORMAL LOW (ref 39.0–52.0)
Hemoglobin: 11.7 g/dL — ABNORMAL LOW (ref 13.0–17.0)
LYMPHS ABS: 2.2 10*3/uL (ref 0.7–4.0)
LYMPHS PCT: 22 %
MCH: 30.3 pg (ref 26.0–34.0)
MCHC: 32.9 g/dL (ref 30.0–36.0)
MCV: 92.2 fL (ref 78.0–100.0)
MONO ABS: 0.7 10*3/uL (ref 0.1–1.0)
Monocytes Relative: 7 %
Neutro Abs: 6.3 10*3/uL (ref 1.7–7.7)
Neutrophils Relative %: 64 %
PLATELETS: 380 10*3/uL (ref 150–400)
RBC: 3.86 MIL/uL — ABNORMAL LOW (ref 4.22–5.81)
RDW: 13.1 % (ref 11.5–15.5)
WBC: 9.9 10*3/uL (ref 4.0–10.5)

## 2015-10-25 LAB — COMPREHENSIVE METABOLIC PANEL
ALBUMIN: 2.6 g/dL — AB (ref 3.5–5.0)
ALK PHOS: 108 U/L (ref 38–126)
ALT: 23 U/L (ref 17–63)
ANION GAP: 7 (ref 5–15)
AST: 34 U/L (ref 15–41)
BILIRUBIN TOTAL: 0.6 mg/dL (ref 0.3–1.2)
BUN: 18 mg/dL (ref 6–20)
CHLORIDE: 105 mmol/L (ref 101–111)
CO2: 24 mmol/L (ref 22–32)
Calcium: 8.9 mg/dL (ref 8.9–10.3)
Creatinine, Ser: 1.3 mg/dL — ABNORMAL HIGH (ref 0.61–1.24)
GFR calc Af Amer: >60 mL/min (ref >60)
GFR calc non Af Amer: 59 mL/min — ABNORMAL LOW (ref >60)
GLUCOSE: 112 mg/dL — AB (ref 65–99)
POTASSIUM: 3.7 mmol/L (ref 3.5–5.1)
SODIUM: 136 mmol/L (ref 135–145)
Total Protein: 7.1 g/dL (ref 6.5–8.1)

## 2015-10-25 NOTE — ED Notes (Addendum)
Therapy in with patient at this time.

## 2015-10-25 NOTE — Progress Notes (Signed)
Pt's bed offer from [facility] has been rescinded due to issues with pt's Medicaid application/disability claim.  Pt does not have appropriate care at home as his son cannot care for him, as well as care  for pt's other son who is ESRD, blind and with a recent LE amputation. Case has been discussed, at length, with SW AD and CSW is currently waiting on recommendations from [facility].  CSW will continue to follow for disposition.  Pollyann SavoyJody Cynthya Yam, [place]CS[place] [phone]

## 2015-10-25 NOTE — Evaluation (Signed)
Clinical/Bedside Swallow Evaluation Patient Details  Name: Francisco Obrien MRN: [id number] Date of Birth: 1956/08/11  Today's Date: 10/25/2015 Time: SLP Start Time (ACUTE ONLY): 0913 SLP Stop Time (ACUTE ONLY): 0921 SLP Time Calculation (min) (ACUTE ONLY): 8 min  Past Medical History:  Past Medical History  Diagnosis Date  . Stroke (HCC)   . Coronary artery disease   . CHF (congestive heart failure) (HCC)    Past Surgical History: History reviewed. No pertinent past surgical history. HPI:  pt presents with CVA ~ 3 wks prior to admit and hx of CAD and CHF.    Assessment / Plan / Recommendation Clinical Impression  Pt has mild difficulties with mastication and clearance of oral residue, however suspect that this may not be far from baseline given the condition of his dentition. He does seem to be aware of residue, and requires only Min cues from SLP to manage. When challenged to drink liquids with solids still in mouth, pt had a strong, immediate cough response, concerning for airway invasion. However, when prompted to try again, pt shows good awareness by declining additional liquids until he had cleared more solid residue from his mouth. Recommend regular textures and thin liquids, although pt will likely benefit from full supervision to assist with self-feeding due to weakness, right-sided inattention, and expressive/receptive aphasia (unclear what baseline is s/p recent CVA).    Aspiration Risk  Mild aspiration risk    Diet Recommendation  Regular textures, thin liquids   Medication Administration: Whole meds with liquid    Other  Recommendations Oral Care Recommendations: Oral care BID   Follow up Recommendations  Skilled Nursing facility    Frequency and Duration min 2x/week  1 week       Prognosis Prognosis for Safe Diet Advancement: Good Barriers to Reach Goals: Cognitive deficits;Language deficits      Swallow Study   General Date of Onset:  (CVA ~3 weeks ago) HPI:  pt presents with CVA ~ 3 wks prior to admit and hx of CAD and CHF.  Type of Study: Bedside Swallow Evaluation Previous Swallow Assessment: none in chart, however pt was in [facility] [place] for recent CVA, records not available Diet Prior to this Study: Other (Comment) (unclear - order NPO but receiving meal trays) Respiratory Status: Room air History of Recent Intubation: No Behavior/Cognition: Alert;Cooperative;Pleasant mood;Requires cueing;Other (Comment) (aphasic) Oral Cavity Assessment: Within Functional Limits Oral Care Completed by SLP: No Oral Cavity - Dentition: Poor condition;Missing dentition Vision: Functional for self-feeding (right inattention) Self-Feeding Abilities: Able to feed self;Needs set up;Needs assist Patient Positioning: Upright in bed Baseline Vocal Quality: Other (comment) (mildly rough) Volitional Cough: Strong Volitional Swallow: Able to elicit    Oral/Motor/Sensory Function     Ice Chips Ice chips: Not tested   Thin Liquid Thin Liquid: Impaired Presentation: Cup;Self Fed;Straw Pharyngeal  Phase Impairments: Suspected delayed Swallow;Cough - Immediate    Nectar Thick Nectar Thick Liquid: Not tested   Honey Thick Honey Thick Liquid: Not tested   Puree Puree: Within functional limits Presentation: Self Fed;Spoon   Solid Solid: Impaired Presentation: Self Fed Oral Phase Functional Implications: Oral residue      Maxcine HamLaura Paiewonsky, M.A. CCC-SLP [phone]  Maxcine Hamaiewonsky, Temica Righetti 10/25/2015,9:34 AM

## 2015-10-25 NOTE — Discharge Planning (Signed)
NCM consulted by Reyne Dumasammy Blakely of [facility] to assist with getting disability papers for pt from son, [name].  NCM called Quran without success.  Quran not at home number and no answer on mobile phone, left message.

## 2015-10-25 NOTE — Progress Notes (Signed)
OT Evaluation   Pt presents with R hemiplegia, expressive aphasia and apparent ideomotor apraxia. Pt does well with gestural cues. Perseveration noted. Pt has excellent rehab potential. Very motivated to participate with OT. Pt would benefit from skilled OT services in post acute rehab to maximize functional level of independence with ADL and mobility. Will folow acutely. Pt very appreciative of help.    10/25/15 1400  OT Visit Information  Last OT Received On 10/25/15  Assistance Needed +2  History of Present Illness pt presents with CVA ~ 3 wks prior to admit and hx of CAD and CHF.    Precautions  Precautions Fall  Precaution Comments aphasic  Home Living  Family/patient expects to be discharged to: Skilled nursing facility  Prior Function  Level of Independence Needs assistance  Comments recent CVA. Appears to have been independent prior to CVA  Communication  Communication Expressive difficulties  Pain Assessment  Pain Assessment Faces  Faces Pain Scale 0  Cognition  Arousal/Alertness Awake/alert  Behavior During Therapy WFL for tasks assessed/performed  Overall Cognitive Status Difficult to assess  Difficult to assess due to Impaired communication  Upper Extremity Assessment  Upper Extremity Assessment RUE deficits/detail  RUE Deficits / Details no active movement. increased tone proximally  RUE Sensation decreased light touch  RUE Coordination decreased fine motor;decreased gross motor (nonfunctional RUE)  Lower Extremity Assessment  Lower Extremity Assessment RLE deficits/detail  RLE Deficits / Details Demonstrates active ab/adduction when isolated in bridging position  RLE Sensation decreased light touch  Cervical / Trunk Assessment  Cervical / Trunk Assessment Normal  ADL  Overall ADL's  Needs assistance/impaired  Eating/Feeding Set up;Supervision/ safety  Grooming Minimal assistance  Grooming Details (indicate cue type and reason) able to wash face after set up.  Apply wo attempt oral care with min A  Upper Body Bathing Minimal assitance;Supervision/ safety;Set up  Upper Body Bathing Details (indicate cue type and reason) gestural cues to continue due to apparent apraxia  Lower Body Bathing Moderate assistance;Bed level  Upper Body Dressing  Maximal assistance  Lower Body Dressing Maximal assistance  Functional mobility during ADLs Moderate assistance (sit - stand)  Vision- Assessment  Additional Comments need to further assess  Praxis  Praxis tested? Deficits  Deficits Perseveration;Ideomotor;Organization  Bed Mobility  Supine to sit Mod assist;HOB elevated  General bed mobility comments does well with gestural cues  Transfers  Lateral/Scoot Transfers Mod assist  Balance  Overall balance assessment Needs assistance  Sitting balance-Leahy Scale Fair  Standing balance-Leahy Scale Poor  OT - End of Session  Activity Tolerance Patient tolerated treatment well  Patient left in bed;with call bell/phone within reach  Nurse Communication Mobility status;Other (comment) (D/C plan)  OT Assessment  OT Therapy Diagnosis  Generalized weakness;Cognitive deficits;Disturbance of vision;Hemiplegia dominant side;Apraxia  OT Recommendation/Assessment Patient needs continued OT Services  OT Problem List Decreased strength;Decreased range of motion;Decreased activity tolerance;Impaired balance (sitting and/or standing);Impaired vision/perception;Decreased coordination;Decreased cognition;Decreased safety awareness;Decreased knowledge of use of DME or AE;Impaired sensation;Impaired tone;Impaired UE functional use  Barriers to Discharge Decreased caregiver support  OT Plan  OT Frequency (ACUTE ONLY) Min 3X/week  OT Treatment/Interventions (ACUTE ONLY) Self-care/ADL training;Therapeutic exercise;Neuromuscular education;DME and/or AE instruction;Therapeutic activities;Cognitive remediation/compensation;Visual/perceptual remediation/compensation;Patient/family  education;Balance training  OT Recommendation  Recommendations for Other Services Other (comment) (Rehab)  Follow Up Recommendations Supervision/Assistance - 24 hour  OT Equipment 3 in 1 bedside comode;Tub/shower bench;Wheelchair (measurements OT);Wheelchair cushion (measurements OT);Other (comment) (TBA)  Individuals Consulted  Consulted and Agree with Results and Recommendations Patient unable/family  or caregiver not available  Acute Rehab OT Goals  Patient Stated Goal pt unable to state.  OT Goal Formulation Patient unable to participate in goal setting  Time For Goal Achievement 11/08/15  Potential to Achieve Goals Good  OT Time Calculation  OT Start Time (ACUTE ONLY) 1410  OT Stop Time (ACUTE ONLY) 1437  OT Time Calculation (min) 27 min  OT G-codes **NOT FOR INPATIENT CLASS**  Functional Assessment Tool Used clinical judgement  Functional Limitation Self care  Self Care Current Status (H0865) CM  Self Care Goal Status (H8469) CJ  OT General Charges  $OT Visit 1 Procedure  OT Evaluation  $Initial OT Evaluation Tier I 1 Procedure  OT Treatments  $Self Care/Home Management  8-22 mins  Written Expression  Dominant Hand Right  [facility], OTR/L  [phone] 10/25/2015

## 2015-10-25 NOTE — Progress Notes (Signed)
OT NOTE  Attempted to contact SW to determine if OT eval needed for SNF admission. Please page OT @ [phone] if eval needed. Thanks  [facility] Brena Windsor, OTR/L  [phone] 10/25/2015

## 2015-10-25 NOTE — Progress Notes (Signed)
CSW has secured bed for pt [facility] 10/[facility]/15 at [facility].  Bed offer presented to pt's son, Francisco LandsbergRenee, who will discuss with his brother, Francisco DarterQuran this pm.  CSW to f/u with pt's family later this pm re: decision.

## 2015-10-25 NOTE — ED Notes (Addendum)
Incontinence care given at this time.  Clean linen applied to stretcher.  Condom cath replaced.  No distress noted or complaints voiced at this time.  Will not leave O2 sensor or bp cuff in place.  Did allow me to get a set of vitals at this time and then remove them.  Encouraged to call for assistance as needed.  Bed in lowest position with call bell within reach.

## 2015-10-25 NOTE — ED Notes (Signed)
Lunch tray setup at bedside. Patient ate strawberry cake, did not eat salad, stated that he did not want any other food on his tray.

## 2015-10-25 NOTE — Progress Notes (Signed)
Physical Therapy Treatment Patient Details Name: Francisco Obrien MRN: [id number] DOB: [birth year] Today's Date: 10/25/2015    History of Present Illness pt presents with CVA ~ 3 wks prior to admit and hx of CAD and CHF.      PT Comments    Pt continues to need gestural cueing in order to follow simple directions as pt is not understanding all verbalizations.  Pt is eager to participate in mobility and would benefit from continued therapy to maximize independence.  Will continue to follow.    Follow Up Recommendations  SNF     Equipment Recommendations  None recommended by PT    Recommendations for Other Services       Precautions / Restrictions Precautions Precautions: Fall Precaution Comments: Global Aphasia? Restrictions Weight Bearing Restrictions: No    Mobility  Bed Mobility Overal bed mobility: Needs Assistance Bed Mobility: Supine to Sit;Sit to Supine     Supine to sit: Mod assist;HOB elevated Sit to supine: Mod assist;HOB elevated   General bed mobility comments: pt needs gestural cues for following directions, but then is able to use L UE and LE to A with coming to sitting and returning to supine.  Transfers Overall transfer level: Needs assistance   Transfers: Lateral/Scoot Transfers          Lateral/Scoot Transfers: Mod assist General transfer comment: Performed leteral scoots towards HOB to R side.  Gestural cues for following directions.  A with R LE and scooting hips.  pt uses L UE to A with pushing during scooting.    Ambulation/Gait                 Stairs            Wheelchair Mobility    Modified Rankin (Stroke Patients Only)       Balance Overall balance assessment: Needs assistance Sitting-balance support: Single extremity supported;Feet unsupported Sitting balance-Leahy Scale: Fair Sitting balance - Comments: Needs L UE support and unable to accept balance challenges without A.                               Cognition Arousal/Alertness: Awake/alert Behavior During Therapy: WFL for tasks assessed/performed Overall Cognitive Status: Difficult to assess                      Exercises      General Comments        Pertinent Vitals/Pain Pain Assessment: Faces Faces Pain Scale: No hurt    Home Living                      Prior Function            PT Goals (current goals can now be found in the care plan section) Acute Rehab PT Goals Patient Stated Goal: pt unable to state. PT Goal Formulation: Patient unable to participate in goal setting Time For Goal Achievement: 11/06/15 Potential to Achieve Goals: Fair Progress towards PT goals: Progressing toward goals    Frequency  Min 2X/week    PT Plan Current plan remains appropriate    Co-evaluation             End of Session   Activity Tolerance: Patient tolerated treatment well Patient left: in bed;with call bell/phone within reach     Time: 0840-0853 PT Time Calculation (min) (ACUTE ONLY): 13 min  Charges:  $Therapeutic Activity: 8-22 mins  G CodesSunny Schlein:      Sadaf Przybysz F, [place]PT [phone] 10/25/2015, 9:17 AM

## 2015-10-25 NOTE — ED Notes (Signed)
Patient was given gram crackers and ginger ale. A heart healthy diet was ordered for lunch.

## 2015-10-26 LAB — URINALYSIS, ROUTINE W REFLEX MICROSCOPIC
Bilirubin Urine: NEGATIVE
GLUCOSE, UA: NEGATIVE mg/dL
HGB URINE DIPSTICK: NEGATIVE
KETONES UR: NEGATIVE mg/dL
LEUKOCYTES UA: NEGATIVE
Nitrite: NEGATIVE
PH: 6 (ref 5.0–8.0)
PROTEIN: NEGATIVE mg/dL
Specific Gravity, Urine: 1.016 (ref 1.005–1.030)

## 2015-10-26 MED ORDER — CLOPIDOGREL BISULFATE 75 MG PO TABS: 75.0000 mg | ORAL_TABLET | Freq: Every day | ORAL | Status: DC

## 2015-10-26 MED ORDER — TAMSULOSIN HCL 0.4 MG PO CAPS: 0.4000 mg | ORAL_CAPSULE | Freq: Every day | ORAL | Status: DC

## 2015-10-26 MED ORDER — AMLODIPINE BESYLATE 10 MG PO TABS: 10.0000 mg | ORAL_TABLET | Freq: Every day | ORAL | Status: DC

## 2015-10-26 MED ORDER — ASPIRIN 81 MG PO TABS: 81.0000 mg | ORAL_TABLET | Freq: Every day | ORAL | Status: AC

## 2015-10-26 MED ORDER — PANTOPRAZOLE SODIUM 40 MG PO TBEC: 40.0000 mg | DELAYED_RELEASE_TABLET | Freq: Every day | ORAL | Status: DC

## 2015-10-26 MED ORDER — FUROSEMIDE 40 MG PO TABS: 40.0000 mg | ORAL_TABLET | Freq: Every day | ORAL | Status: DC

## 2015-10-26 MED ORDER — ATORVASTATIN CALCIUM 40 MG PO TABS: 40.0000 mg | ORAL_TABLET | Freq: Every day | ORAL | Status: DC

## 2015-10-26 MED ORDER — CARVEDILOL 6.25 MG PO TABS: 6.2500 mg | ORAL_TABLET | Freq: Two times a day (BID) | ORAL | Status: DC

## 2015-10-26 MED ORDER — HYDRALAZINE HCL 25 MG PO TABS: 25.0000 mg | ORAL_TABLET | Freq: Three times a day (TID) | ORAL | Status: DC

## 2015-10-26 NOTE — Progress Notes (Signed)
CSW received phone call from ArtemusHarriet at [facility], who informed CSW that patient is able to come on today. Facility Representative will need DC Summary and patient's family to complete paperwork.   CSW got in contact with son Antonieta PertQuran Hall who states the family has decided to send the patient to [facility]. Per Mr. Margo AyeHall, he will be here at 1:00pm to transport the patient to facility. Family is also able to complete requested paperwork at facility.   CSW will notify MD regarding discharge summary.   Fernande BoydenJoyce Donelda Mailhot, LCSWA Clinical Social Worker [facility] Ph: [phone]

## 2015-10-26 NOTE — ED Notes (Signed)
Pt took meds w/o difficulty.  

## 2015-10-26 NOTE — Progress Notes (Signed)
Late entry from 10/24/09.  Bed offer discussed with pt's son [name] who will inform w/e CSW of his decision re: placement for his father.  Quran is aware that if he refuses bed offer, then pt will have to d/c home.  Quran also aware that family will be responsible for [facility] to [facility], as the hospital will not be covering the cost of transport.  CSW will continue to follow for disposition.  Illene SilverJody Hannelore Bova, LCSW W/E Coverage [facility] [phone]

## 2015-10-26 NOTE — Progress Notes (Signed)
CSW attempted to contact son Forestine ChuteQuran Byrd at [phone] and [phone] but received no answer. CSW was unable to leave voice message to numbers listed above. CSW attempted to reach out to Armida SansMeredith Gasparini, other emergency contact listed and left voice message at 9:11am. CSW will continue to follow up regarding disposition.   CSW will also continue to provide support to patient while in hospital.   Fernande BoydenJoyce Jadelyn Elks, [facility] Mem HsptlCSWA Clinical Social Worker [facility] Ph: [phone]

## 2015-10-26 NOTE — ED Notes (Signed)
Gown placed on pt w/brief. Son requested for pt to have his sweater put on - RN and Madelaine Bhatdam, EMT, did as requested. Son placed on pt's tobaggon and has pt's black bag that was in room w/pt. No belongings left in ED.

## 2015-10-26 NOTE — ED Notes (Addendum)
Pt's son arrived to transport pt to [facility]. RN gave son paperwork for nursing facility including prescriptions and MCD application form back - pt did not sign.

## 2015-10-26 NOTE — ED Notes (Signed)
Per Alona BeneJoyce, SW, pt's son should be arriving around 1300 to transport pt to nursing facility. Paperwork in envelope for facility and MCD paperwork son brought early this am is in an envelope to return to him.

## 2016-12-08 IMAGING — CR DG CHEST 1V PORT
1 series · 1 of 1 positions shown · non-contrast
Comparison: None.

CLINICAL DATA: 59-year-old male with shortness of breath

EXAM:
PORTABLE CHEST 1 VIEW

[AP]
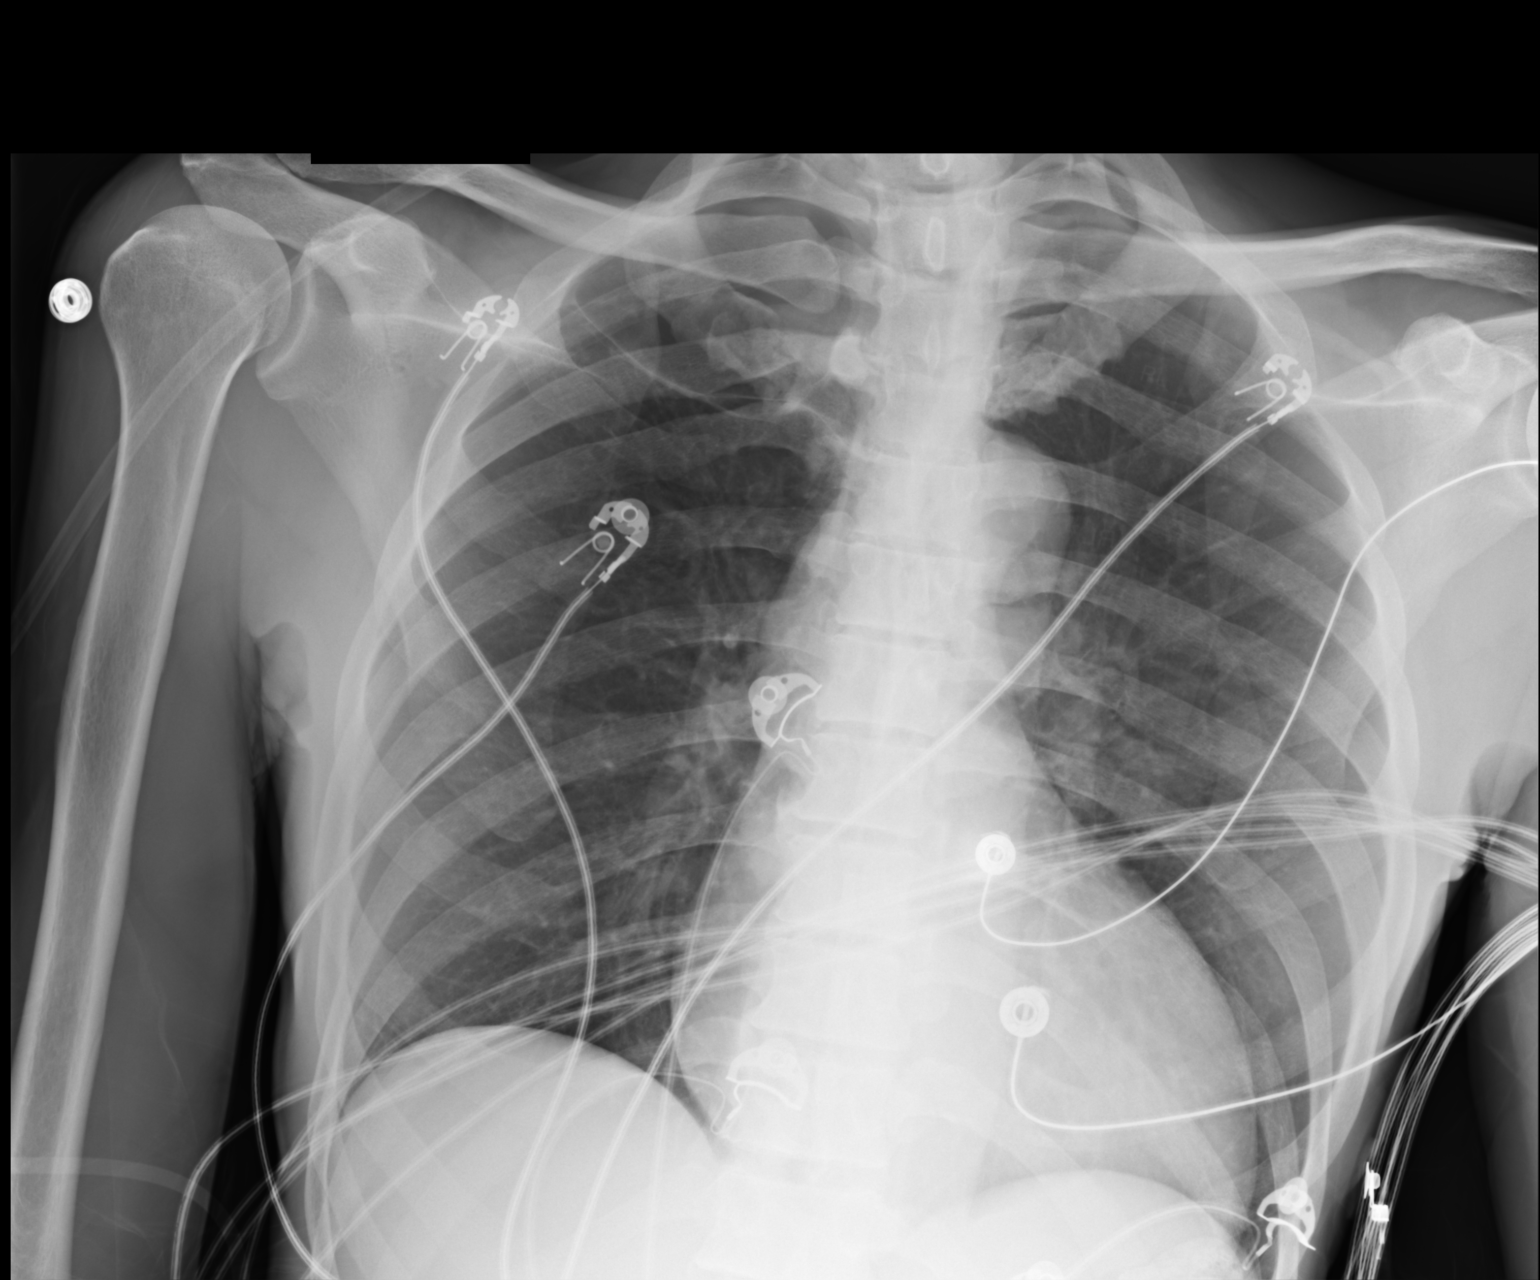

[1 of 1 positions shown; findings below may reference images not displayed]

FINDINGS: Single portable view of the chest demonstrate clear lungs with sharp
costophrenic angles. No pneumothorax but top-normal cardiac
silhouette. The osseous structures appear unremarkable.
IMPRESSION: No active disease.

## 2019-05-22 ENCOUNTER — Emergency Department (HOSPITAL_COMMUNITY)
Admission: EM | Admit: 2019-05-22 | Discharge: 2019-05-22 | Disposition: A | Discharge status: Home / Self Care | Payer: Medicaid Other | Attending: Emergency Medicine | Admitting: Emergency Medicine

## 2019-05-22 ENCOUNTER — Other Ambulatory Visit: Payer: Self-pay

## 2019-05-22 ENCOUNTER — Ambulatory Visit (HOSPITAL_COMMUNITY)
Admission: EM | Admit: 2019-05-22 | Discharge: 2019-05-22 | Disposition: A | Discharge status: Home / Self Care | Payer: Medicaid Other

## 2019-05-22 ENCOUNTER — Encounter (HOSPITAL_COMMUNITY): Payer: Self-pay

## 2019-05-22 ENCOUNTER — Encounter (HOSPITAL_COMMUNITY): Payer: Self-pay | Admitting: Emergency Medicine

## 2019-05-22 DIAGNOSIS — Z8673 Personal history of transient ischemic attack (TIA), and cerebral infarction without residual deficits: Secondary | ICD-10-CM

## 2019-05-22 DIAGNOSIS — I2510 Atherosclerotic heart disease of native coronary artery without angina pectoris: Secondary | ICD-10-CM | POA: Insufficient documentation

## 2019-05-22 DIAGNOSIS — F17200 Nicotine dependence, unspecified, uncomplicated: Secondary | ICD-10-CM | POA: Insufficient documentation

## 2019-05-22 DIAGNOSIS — G8191 Hemiplegia, unspecified affecting right dominant side: Secondary | ICD-10-CM

## 2019-05-22 DIAGNOSIS — Z7902 Long term (current) use of antithrombotics/antiplatelets: Secondary | ICD-10-CM | POA: Insufficient documentation

## 2019-05-22 DIAGNOSIS — M7918 Myalgia, other site: Secondary | ICD-10-CM | POA: Diagnosis present

## 2019-05-22 DIAGNOSIS — Z79899 Other long term (current) drug therapy: Secondary | ICD-10-CM | POA: Insufficient documentation

## 2019-05-22 DIAGNOSIS — Z7982 Long term (current) use of aspirin: Secondary | ICD-10-CM | POA: Diagnosis not present

## 2019-05-22 DIAGNOSIS — I509 Heart failure, unspecified: Secondary | ICD-10-CM | POA: Insufficient documentation

## 2019-05-22 DIAGNOSIS — R519 Headache, unspecified: Secondary | ICD-10-CM

## 2019-05-22 DIAGNOSIS — R5381 Other malaise: Secondary | ICD-10-CM | POA: Diagnosis not present

## 2019-05-22 NOTE — TOC Initial Note (Signed)
Transition of Care [facility]) - Initial/Assessment Note    Patient Details  Name: Francisco Obrien MRN: [id number] Date of Birth: 1956-02-01  [facility] of [facility]) CM/SW Contact:    Fuller Mandril, RN Phone Number: 05/22/2019, 12:36 PM  Clinical Narrative:                 [facility] consulted regarding PCP establishment for pt.  Expected Discharge Plan: Home/Self Care Barriers to Discharge: Continued Medical Work up   Patient Goals and CMS Choice Patient states their goals for this hospitalization and ongoing recovery are:: find a PCP      Expected Discharge Plan and Services Expected Discharge Plan: Home/Self Care   Discharge Planning Services: CM Consult   Living arrangements for the past 2 months: Apartment                                      Prior Living Arrangements/Services Living arrangements for the past 2 months: Apartment Lives with:: Adult Children Patient language and need for interpreter reviewed:: Yes        Need for Family Participation in Patient Care: Yes (Comment) Care giver support system in place?: Yes (comment)   Criminal Activity/Legal Involvement Pertinent to Current Situation/Hospitalization: No - Comment as needed  Activities of Daily Living      Permission Sought/Granted                  Emotional Assessment Appearance:: Appears older than stated age Attitude/Demeanor/Rapport: Engaged Affect (typically observed): Appropriate, Pleasant Orientation: : Oriented to Self, Oriented to Place, Oriented to  Time, Oriented to Situation Alcohol / Substance Use: Not Applicable Psych Involvement: No (comment)  Admission diagnosis:  Right sided pain There are no active problems to display for this patient.  PCP:  Patient, No Pcp Per Pharmacy:   [facility] [place], [place] [address] Phone: [phone] Fax:  [phone]     Social Determinants of Health (SDOH) Interventions    Readmission Risk Interventions No flowsheet data found.

## 2019-05-22 NOTE — ED Provider Notes (Signed)
[facility]ERGENC[facility] DEPARTMENT Provider Note   CSN: [id number] Arrival date & time: 05/22/19  1030    History   Chief Complaint Chief Complaint  Patient presents with  . Joint Pain    HPI Francisco Obrien is a 63 y.o. male with past medical history of CHF, CAD, stroke with residual right-sided hemiparesis, presenting to the emergency department with complaint of right-sided body aches.  Patient's son is present, states they have just moved here from [place] about 1 to 1-1/2 months ago.  6 months ago he stopped getting regular physical therapy due to the COVID-19 pandemic.  He states since then he has developed a gradual onset of aching, worse in his joints in his right knee.  He has also developed some swelling in his right foot.  He has not established primary care yet here in [place].  He was seeing improvement in symptoms when he was in PT, however they returned when he stopped.  He has no new weakness or numbness.  No fevers.  Currently on aspirin and Plavix, as well as with other cardiac and blood pressure medications.     The history is provided by the patient and a relative.    Past Medical History:  Diagnosis Date  . CHF (congestive heart failure) ([facility])   . Coronary artery disease   . Stroke [facility])     There are no active problems to display for this patient.   History reviewed. No pertinent surgical history.      Home Medications    Prior to Admission medications   Medication Sig Start Date End Date Taking? Authorizing Provider  albuterol (VENTOLIN HFA) 108 (90 Base) MCG/ACT inhaler Inhale 2 puffs into the lungs every 6 (six) hours as needed for wheezing or shortness of breath.   Yes [provider]  amLODipine (NORVASC) 10 MG tablet Take 1 tablet (10 mg total) by mouth daily. 10/26/15  Yes Mackuen, Courteney Lyn, MD  aspirin 81 MG tablet Take 1 tablet (81 mg total) by mouth daily. 10/26/15  Yes Mackuen, Courteney Lyn, MD   atorvastatin (LIPITOR) 40 MG tablet Take 1 tablet (40 mg total) by mouth at bedtime. 10/26/15  Yes Mackuen, Courteney Lyn, MD  baclofen (LIORESAL) 10 MG tablet Take 10 mg by mouth 3 (three) times daily.   Yes [provider]  carvedilol (COREG) 6.25 MG tablet Take 1 tablet (6.25 mg total) by mouth 2 (two) times daily with a meal. 10/26/15  Yes Mackuen, Courteney Lyn, MD  clopidogrel (PLAVIX) 75 MG tablet Take 1 tablet (75 mg total) by mouth daily. 10/26/15  Yes Mackuen, Courteney Lyn, MD  ergocalciferol (VITAMIN D2) 1.25 MG (50000 UT) capsule Take 50,000 Units by mouth once a week.   Yes [provider]  folic acid (FOLVITE) 1 MG tablet Take 1 mg by mouth daily.   Yes [provider]  lisinopril (ZESTRIL) 10 MG tablet Take 10 mg by mouth daily.   Yes [provider]  furosemide (LASIX) 40 MG tablet Take 1 tablet (40 mg total) by mouth daily. Patient not taking: Reported on 05/22/2019 10/26/15   Mackuen, Courteney Lyn, MD  hydrALAZINE (APRESOLINE) 25 MG tablet Take 1 tablet (25 mg total) by mouth 3 (three) times daily. Patient not taking: Reported on 05/22/2019 10/26/15   Mackuen, Courteney Lyn, MD  pantoprazole (PROTONIX) 40 MG tablet Take 1 tablet (40 mg total) by mouth daily. Patient not taking: Reported on 05/22/2019 10/26/15   Mackuen, Courteney Lyn,  MD  tamsulosin (FLOMAX) 0.4 MG CAPS capsule Take 1 capsule (0.4 mg total) by mouth daily. Patient not taking: Reported on 05/22/2019 10/26/15   Abelino DerrickMackuen, Courteney Lyn, MD    Family History Family History  Family history unknown: Yes    Social History Social History   Tobacco Use  . Smoking status: Current Every Day Smoker  Substance Use Topics  . Alcohol use: Yes  . Drug use: No     Allergies   Patient has no known allergies.   Review of Systems Review of Systems  All other systems reviewed and are negative.    Physical Exam Updated Vital Signs BP (!) 148/103 (BP Location: Right Arm)   Pulse  76   Temp 97.6 F (36.4 C) (Oral)   Resp (!) 24   SpO2 100%   Physical Exam Vitals signs and nursing note reviewed.  Constitutional:      General: He is not in acute distress.    Appearance: He is well-developed.  HENT:     Head: Normocephalic and atraumatic.  Eyes:     Conjunctiva/sclera: Conjunctivae normal.  Cardiovascular:     Rate and Rhythm: Normal rate and regular rhythm.  Pulmonary:     Effort: Pulmonary effort is normal. No respiratory distress.     Breath sounds: Normal breath sounds.  Abdominal:     General: Bowel sounds are normal.     Palpations: Abdomen is soft.     Tenderness: There is no abdominal tenderness. There is no guarding or rebound.  Musculoskeletal:     Comments: Some pain with movement of the right knee.  There is no swelling to the knee.  There is 3-4+ pitting edema to the dorsum of the right foot.  No redness or warmth.  No calf tenderness.  Intact distal pulses.  Right hand appears contracted, no tenderness to the right upper extremity or pain with range of motion. Left lower extremity without swelling.  Skin:    General: Skin is warm.  Neurological:     Mental Status: He is alert.     Comments: Arm and leg with weakness.  (Chronic)  Psychiatric:        Behavior: Behavior normal.      ED Treatments / Results  Labs (all labs ordered are listed, but only abnormal results are displayed) Labs Reviewed - No data to display  EKG EKG Interpretation  Date/Time:  Monday May 22 2019 10:58:42 EDT Ventricular Rate:  58 PR Interval:    QRS Duration: 114 QT Interval:  421 QTC Calculation: 414 R Axis:   -94 Text Interpretation:  Age not entered, assumed to be  63 years old for purpose of ECG interpretation Sinus rhythm Inferior infarct, old Anterior infarct, old Confirmed by Pricilla LovelessGoldston, Scott [phone]([id number]) on 05/22/2019 11:29:09 AM   Radiology No results found.  Procedures Procedures (including critical care time)  Medications Ordered in ED  Medications - No data to display   Initial Impression / Assessment and Plan / ED Course  I have reviewed the triage vital signs and the nursing notes.  Pertinent labs & imaging results that were available during my care of the patient were reviewed by me and considered in my medical decision making (see chart for details).  Clinical Course as of May 21 1330  Mon May 22, 2019  1251 Case management provided resources and guidance for PCP   [JR]    Clinical Course User Index [JR] Robinson, [place] N, [place]-C  Patient presenting with gradual onset of aching to his right upper and lower extremity, status post stroke with residual right hemiparesis.  [place] and [place] established primary care.  He has been out of PT/OT for the last 6 months, mostly attributed to the COVID-19 pandemic.  Symptoms are most likely secondary to deconditioning.  Exam is not consistent with DVT, no new neuro sx.  Patient is compliant on Plavix and aspirin.  Case management and social work consulted for resources and assistance establishing primary care.  Recommend over-the-counter medication such as Tylenol to treat pain. Pt discussed with Dr. Criss AlvineGoldston, who is agreeable with plan. Safe for discharge.  Final Clinical Impressions(s) / ED Diagnoses   Final diagnoses:  Physical deconditioning  History of stroke    ED Discharge Orders    None       Robinson, [place] N, PA-C 05/22/19 1331    Pricilla LovelessGoldston, Scott, MD 05/22/19 1355

## 2019-05-22 NOTE — ED Triage Notes (Signed)
Pt presents with pain on his right side from his shoulders to his feet. Pt has hemiparesis on right side.

## 2019-05-22 NOTE — ED Triage Notes (Signed)
Pt arrives with son to the ER with c/o of right sided body and joint pain due to no PT for 3-4 months because of covid. Pt had a stroke in 2017 which left him with right sided paralysis.

## 2019-05-22 NOTE — Discharge Instructions (Signed)
I do recommend going to the ER to ensure nothing acute is in process currently.

## 2019-05-22 NOTE — ED Notes (Signed)
Patient is being discharged from the Urgent [facility] and sent to the Emergency Department via wheelchair by family member. Per Provider Augusto Gamble, patient is stable but in need of higher level of care due to possible stroke. Patient is aware and verbalizes understanding of plan of care.    Vitals:   05/22/19 1000  BP: (!) 159/101  Pulse: 72  Resp: 17  Temp: 97.8 F (36.6 C)  SpO2: 99%

## 2019-05-22 NOTE — Discharge Instructions (Addendum)
Once you establish primary care, they can prescribe physical/occupational therapy. You can treat your pains with tylenol.

## 2019-05-22 NOTE — ED Notes (Signed)
Pt is NSR w/BBB on monitor 

## 2019-05-22 NOTE — ED Provider Notes (Signed)
[facility]    CSN: [id number] Arrival date & time: 05/22/19  0920      History   Chief Complaint Chief Complaint  Patient presents with  . Right Side Pain    HPI Francisco Obrien is a 63 y.o. male.   Francisco Obrien presents with family with complaints of pain to entire right side of body to include, headache, arm, leg. Hx of CVA with right sided hemiparesis. He has had intermittent right sided pain in the past, especially to his right foot. However, over the past 3-4 days his pain has been worse. Denies vision changes. No new injury. Son states he feels that Francisco Obrien has had decreased speech and strength recently, but states it has been gradual since no longer participation in P/T and O/T due to covid closures. [place] 1[place] He has still been taking his medications.     ROS per HPI, negative if not otherwise mentioned.      Past Medical History:  Diagnosis Date  . CHF (congestive heart failure) ([facility])   . Coronary artery disease   . Stroke [facility])     There are no active problems to display for this patient.   History reviewed. No pertinent surgical history.     Home Medications    Prior to Admission medications   Medication Sig Start Date End Date Taking? Authorizing Provider  baclofen (LIORESAL) 10 MG tablet Take 10 mg by mouth 3 (three) times daily.   Yes [provider]  ergocalciferol (VITAMIN D2) 1.25 MG (50000 UT) capsule Take 50,000 Units by mouth once a week.   Yes [provider]  folic acid (FOLVITE) 1 MG tablet Take 1 mg by mouth daily.   Yes [provider]  lisinopril (ZESTRIL) 10 MG tablet Take 10 mg by mouth daily.   Yes [provider]  amLODipine (NORVASC) 10 MG tablet Take 1 tablet (10 mg total) by mouth daily. 10/26/15   Mackuen, Courteney Lyn, MD  aspirin 81 MG tablet Take 1 tablet (81 mg total) by mouth daily. 10/26/15   Mackuen, Courteney Lyn, MD  atorvastatin (LIPITOR) 40 MG  tablet Take 1 tablet (40 mg total) by mouth at bedtime. 10/26/15   Mackuen, Courteney Lyn, MD  carvedilol (COREG) 6.25 MG tablet Take 1 tablet (6.25 mg total) by mouth 2 (two) times daily with a meal. 10/26/15   Mackuen, Courteney Lyn, MD  clopidogrel (PLAVIX) 75 MG tablet Take 1 tablet (75 mg total) by mouth daily. 10/26/15   Mackuen, Courteney Lyn, MD  furosemide (LASIX) 40 MG tablet Take 1 tablet (40 mg total) by mouth daily. 10/26/15   Mackuen, Courteney Lyn, MD  hydrALAZINE (APRESOLINE) 25 MG tablet Take 1 tablet (25 mg total) by mouth 3 (three) times daily. 10/26/15   Mackuen, Courteney Lyn, MD  pantoprazole (PROTONIX) 40 MG tablet Take 1 tablet (40 mg total) by mouth daily. 10/26/15   Mackuen, Courteney Lyn, MD  tamsulosin (FLOMAX) 0.4 MG CAPS capsule Take 1 capsule (0.4 mg total) by mouth daily. 10/26/15   Mackuen, Cindee Saltourteney Lyn, MD    Family History Family History  Family history unknown: Yes    Social History Social History   Tobacco Use  . Smoking status: Current Every Day Smoker  Substance Use Topics  . Alcohol use: Yes  . Drug use: No     Allergies   Patient has no known allergies.   Review of Systems Review of Systems   Physical Exam  Triage Vital Signs ED Triage Vitals  Enc Vitals Group     BP 05/22/19 1000 (!) 159/101     Pulse Rate 05/22/19 1000 72     Resp 05/22/19 1000 17     Temp 05/22/19 1000 97.8 F (36.6 C)     Temp Source 05/22/19 1000 Oral     SpO2 05/22/19 1000 99 %     Weight --      Height --      Head Circumference --      Peak Flow --      Pain Score 05/22/19 1001 6     Pain Loc --      Pain Edu? --      Excl. in [place]? --    No data found.  Updated Vital Signs BP (!) 159/101 (BP Location: Left Arm)   Pulse 72   Temp 97.8 F (36.6 C) (Oral)   Resp 17   SpO2 99%   Visual Acuity Right Eye Distance:   Left Eye Distance:   Bilateral Distance:    Right Eye Near:   Left Eye Near:    Bilateral Near:     Physical Exam  Constitutional:      Appearance: He is well-developed.  Cardiovascular:     Rate and Rhythm: Normal rate.  Pulmonary:     Effort: Pulmonary effort is normal.  Skin:    General: Skin is warm and dry.  Neurological:     Mental Status: He is alert and oriented to person, place, and time. Mental status is at baseline.     Comments: Right sided deficit is obvious and speech difficulty as well      UC Treatments / Results  Labs (all labs ordered are listed, but only abnormal results are displayed) Labs Reviewed - No data to display  EKG   Radiology No results found.  Procedures Procedures (including critical care time)  Medications Ordered in UC Medications - No data to display  Initial Impression / Assessment and Plan / UC Course  I have reviewed the triage vital signs and the nursing notes.  Pertinent labs & imaging results that were available during my care of the patient were reviewed by me and considered in my medical decision making (see chart for details).     Acute vs chronic right sided paint. Patient initially states this headache is new and worse than usual. Communication is difficult however, and unable to further verify with his what he is experiencing. Significant stroke history and per chart review on blood thinners. Some deterioration per patient's son. I do recommend further evaluation in the ER at this time to ensure this is not an acute headache. Emphasized PCP establish and followup. Patient and son verbalized understanding and agreeable to plan. Safe for self transport to ER. No code stroke- onset 3 days ago- no obvious new deficits.   Final Clinical Impressions(s) / UC Diagnoses   Final diagnoses:  Right hemiparesis ([place])  History of CVA (cerebrovascular accident)  Nonintractable headache, unspecified chronicity pattern, unspecified headache type     Discharge Instructions     I do recommend going to the ER to ensure nothing acute is in process  currently.    ED Prescriptions    None     Controlled Substance Prescriptions [place] Controlled Substance Registry consulted? No   Zigmund Gottron, NP 05/22/19 1029

## 2019-05-22 NOTE — ED Notes (Addendum)
Pt is unable to lift right arm and right leg off of bed.

## 2019-05-22 NOTE — TOC Transition Note (Signed)
Transition of Care [facility]) - CM/SW Discharge Note   Patient Details  Name: Francisco Obrien MRN: [id number] Date of Birth: 04/28/1956  [facility] CM/SW Contact:  Fuller Mandril, RN Phone Number: 05/22/2019, 12:37 PM   Clinical Narrative:     [facility] met with pt and son at bedside regarding PCP establishment.  Pt recently relocated to [facility] from [place], has Medicaid insurance and stays with son.    Barriers to Discharge: Continued Medical Work up   Patient Goals and CMS Choice Patient states their goals for this hospitalization and ongoing recovery are:: find a PCP      Discharge Placement                       Discharge Plan and Services   Discharge Planning Services: CM Consult               [facility] advised that PCP will be located on insurance card and pt will have to utilize PCP on card or have changed with DSS.  Pt and som verbalized understanding.                  Social Determinants of Health (SDOH) Interventions     Readmission Risk Interventions No flowsheet data found.

## 2019-06-02 ENCOUNTER — Ambulatory Visit: Payer: Medicaid Other | Attending: Internal Medicine | Admitting: Internal Medicine

## 2019-06-02 ENCOUNTER — Other Ambulatory Visit: Payer: Self-pay

## 2019-06-02 ENCOUNTER — Encounter: Payer: Self-pay | Admitting: Internal Medicine

## 2019-06-02 VITALS — BP 140/90 | HR 75 | Temp 98.6°F | Resp 16 | Ht 67.0 in

## 2019-06-02 DIAGNOSIS — I2510 Atherosclerotic heart disease of native coronary artery without angina pectoris: Secondary | ICD-10-CM | POA: Diagnosis not present

## 2019-06-02 DIAGNOSIS — Z5900 Homelessness unspecified: Secondary | ICD-10-CM

## 2019-06-02 DIAGNOSIS — R32 Unspecified urinary incontinence: Secondary | ICD-10-CM | POA: Diagnosis not present

## 2019-06-02 DIAGNOSIS — I509 Heart failure, unspecified: Secondary | ICD-10-CM | POA: Insufficient documentation

## 2019-06-02 DIAGNOSIS — Z8673 Personal history of transient ischemic attack (TIA), and cerebral infarction without residual deficits: Secondary | ICD-10-CM | POA: Insufficient documentation

## 2019-06-02 DIAGNOSIS — I110 Hypertensive heart disease with heart failure: Secondary | ICD-10-CM | POA: Insufficient documentation

## 2019-06-02 DIAGNOSIS — I10 Essential (primary) hypertension: Secondary | ICD-10-CM

## 2019-06-02 DIAGNOSIS — Z79899 Other long term (current) drug therapy: Secondary | ICD-10-CM | POA: Insufficient documentation

## 2019-06-02 DIAGNOSIS — E559 Vitamin D deficiency, unspecified: Secondary | ICD-10-CM | POA: Diagnosis not present

## 2019-06-02 DIAGNOSIS — L602 Onychogryphosis: Secondary | ICD-10-CM

## 2019-06-02 DIAGNOSIS — Z7982 Long term (current) use of aspirin: Secondary | ICD-10-CM | POA: Diagnosis not present

## 2019-06-02 DIAGNOSIS — E785 Hyperlipidemia, unspecified: Secondary | ICD-10-CM | POA: Diagnosis not present

## 2019-06-02 DIAGNOSIS — F17200 Nicotine dependence, unspecified, uncomplicated: Secondary | ICD-10-CM

## 2019-06-02 DIAGNOSIS — R159 Full incontinence of feces: Secondary | ICD-10-CM | POA: Diagnosis not present

## 2019-06-02 DIAGNOSIS — R0689 Other abnormalities of breathing: Secondary | ICD-10-CM

## 2019-06-02 DIAGNOSIS — Z7902 Long term (current) use of antithrombotics/antiplatelets: Secondary | ICD-10-CM | POA: Insufficient documentation

## 2019-06-02 DIAGNOSIS — Z8249 Family history of ischemic heart disease and other diseases of the circulatory system: Secondary | ICD-10-CM | POA: Diagnosis not present

## 2019-06-02 DIAGNOSIS — R0989 Other specified symptoms and signs involving the circulatory and respiratory systems: Secondary | ICD-10-CM | POA: Diagnosis not present

## 2019-06-02 DIAGNOSIS — I69351 Hemiplegia and hemiparesis following cerebral infarction affecting right dominant side: Secondary | ICD-10-CM | POA: Diagnosis not present

## 2019-06-02 DIAGNOSIS — F17210 Nicotine dependence, cigarettes, uncomplicated: Secondary | ICD-10-CM | POA: Diagnosis not present

## 2019-06-02 MED ORDER — ALBUTEROL SULFATE HFA 108 (90 BASE) MCG/ACT IN AERS: 2.0000 | INHALATION_SPRAY | Freq: Four times a day (QID) | RESPIRATORY_TRACT | 6 refills | Status: AC | PRN

## 2019-06-02 MED ORDER — CARVEDILOL 12.5 MG PO TABS: 12.5000 mg | ORAL_TABLET | Freq: Two times a day (BID) | ORAL | 5 refills | Status: DC

## 2019-06-02 MED ORDER — AMLODIPINE BESYLATE 10 MG PO TABS: 10.0000 mg | ORAL_TABLET | Freq: Every day | ORAL | 6 refills | Status: DC

## 2019-06-02 MED ORDER — ASPIRIN EC 81 MG PO TBEC: 81.0000 mg | DELAYED_RELEASE_TABLET | Freq: Every day | ORAL | 1 refills | Status: DC

## 2019-06-02 MED ORDER — BACLOFEN 10 MG PO TABS: 10.0000 mg | ORAL_TABLET | Freq: Three times a day (TID) | ORAL | 5 refills | Status: DC

## 2019-06-02 MED ORDER — ATORVASTATIN CALCIUM 40 MG PO TABS: 40.0000 mg | ORAL_TABLET | Freq: Every day | ORAL | 6 refills | Status: DC

## 2019-06-02 MED ORDER — NICOTINE POLACRILEX 2 MG MT GUM: 2.0000 mg | CHEWING_GUM | OROMUCOSAL | 0 refills | Status: DC | PRN

## 2019-06-02 MED ORDER — LISINOPRIL 10 MG PO TABS: 10.0000 mg | ORAL_TABLET | Freq: Every day | ORAL | 6 refills | Status: DC

## 2019-06-02 MED ORDER — CLOPIDOGREL BISULFATE 75 MG PO TABS: 75.0000 mg | ORAL_TABLET | Freq: Every day | ORAL | 6 refills | Status: DC

## 2019-06-02 MED FILL — AMLODIPINE BESYLATE 10 MG T: 10 | 90 days supply | Qty: 90 | Fill #0

## 2019-06-02 MED FILL — BACLOFEN 10 MG TABLET: 10 | 90 days supply | Qty: 270 | Fill #0

## 2019-06-02 MED FILL — LISINOPRIL 10 MG TABS: 10 | 90 days supply | Qty: 90 | Fill #0

## 2019-06-02 MED FILL — SM NICOTINE 2 MG CHEWING GU: 2 | 30 days supply | Qty: 50 | Fill #0

## 2019-06-02 MED FILL — CLOPIDOGREL 75 MG TABLET: 75 | 90 days supply | Qty: 90 | Fill #0

## 2019-06-02 MED FILL — ALBUTEROL SULFATE HFA 108 (: 108 (90 BAS | 25 days supply | Qty: 9 | Fill #0

## 2019-06-02 MED FILL — ATORVASTATIN CALCIUM 40 MG: 40 | 90 days supply | Qty: 90 | Fill #0

## 2019-06-02 MED FILL — CARVEDILOL 12.5 MG TABLET: 12.5 | 90 days supply | Qty: 180 | Fill #0

## 2019-06-02 NOTE — Progress Notes (Signed)
Patient ID: Francisco Obrien, male    DOB: 1956-06-26  MRN: [id number]  CC: Hospitalization Follow-up (ED)   Subjective: Francisco Obrien is a 63 y.o. male who presents for new pt visit and f/u from ER.  Son, Francisco Obrien, his son is with him His concerns today include:  Patient with history of CHF, HTN, CAD (2 stents in 2013 and 2003), CVA  (09/2016) with right hemiparesis, incont of urine and bowel,  tob depen, vit D def  No PCP in area.  Pt relocated from [facility] 2 mths ago.  Son reports that they have been homeless.  They were staying in the shelter during the height of the [place] pandemic.  Currently staying with son's ex-wife.  However son reports that this is not a healthy environment and they will be moving fairly soon.  He is working with various agencies trying to find housing.  CVA: Patient had CVA in 2017 that left him with right hemiparesis, mild dysarthria and incontinence of urine and bladder.  They request prescription for incontinence supplies. -Son request referral for PT/OT.  Patient was receiving therapies prior to COVID and was doing well but has regressed as therapy was discontinued during the pandemic.  Son is his caregiver. -Patient uses a quad cane but spends most of the day in a wheelchair.  He has not had any falls.  Son fills his med box.  Takes baclofen for muscle spasms -Reports good appetite.  Son states that he has started to regain some of his weight. -Denies any problems swallowing solids or liquids.  HTN: Currently on amlodipine, lisinopril, carvedilol.  He is taking his medicines already for today.  Son make sure was that he is on a low-salt diet.  Patient denies any chest pains, shortness of breath, PND or orthopnea. -No device to check blood pressure.  CAD/CHF: He has had 2 stents placed in the past.  Denies any chest pains, shortness of breath, dizziness, PND orthopnea.  Does get some edema in the right leg at times.  Furosemide on med list but son states that  this was discontinued years ago.  CHF listed in the chart as past medical history.  I do not see any echo in the chart.  Tobacco dependence: Smoked for about 45 years.  Currently smokes 1 to 2 cigarettes a day.  Previously smoked 5 to 6 cigarettes a day.  He wants to quit.  Tried to quit in 2014 using nicotine patches.  He was able to stop for 7 months.  He restarted after his wife died.  Past medical history, social history, surgical history, family history reviewed and updated.     Current Outpatient Medications on File Prior to Visit  Medication Sig Dispense Refill  . aspirin 81 MG tablet Take 1 tablet (81 mg total) by mouth daily. 20 tablet 0  . ergocalciferol (VITAMIN D2) 1.25 MG (50000 UT) capsule Take 50,000 Units by mouth once a week.    . folic acid (FOLVITE) 1 MG tablet Take 1 mg by mouth daily.     No current facility-administered medications on file prior to visit.     No Known Allergies  Social History   Socioeconomic History  . Marital status: Single    Spouse name: Not on file  . Number of children: 2  . Years of education: 68  . Highest education level: Not on file  Occupational History  . Not on file  Social Needs  . Financial resource strain: Not  on file  . Food insecurity    Worry: Not on file    Inability: Not on file  . Transportation needs    Medical: Not on file    Non-medical: Not on file  Tobacco Use  . Smoking status: Current Every Day Smoker    Packs/day: 0.25    Years: 45.00    Pack years: 11.25  Substance and Sexual Activity  . Alcohol use: Yes    Comment: occasionally  . Drug use: No  . Sexual activity: Not on file  Lifestyle  . Physical activity    Days per week: Not on file    Minutes per session: Not on file  . Stress: Not on file  Relationships  . Social connections    Talks on phone: Not Musicianon file    Gets together: Not on file    Attends religious service: Not on file    Active member of club or organization: Not on file     Attends meetings of clubs or organizations: Not on file    Relationship status: Not on file  . Intimate partner violence    Fear of current or ex partner: Not on file    Emotionally abused: Not on file    Physically abused: Not on file    Forced sexual activity: Not on file  Other Topics Concern  . Not on file  Social History Narrative  . Not on file    Family History  Problem Relation Age of Onset  . Heart disease Mother   . Diabetes Mother   . Heart disease Father   . Diabetes Father     History reviewed. No pertinent surgical history.  ROS: Review of Systems Negative except as stated above  PHYSICAL EXAM: BP 140/90   Pulse 75   Temp 98.6 F (37 C) (Oral)   Resp 16   Ht 5\' 7"  (1.702 m)   SpO2 99%   BMI 21.93 kg/m   Wt Readings from Last 3 Encounters:  10/22/15 140 lb (63.5 kg)    Physical Exam  General appearance -older African-American male who appears older than his stated age.  He is in a wheelchair and in NAD. Mental status -patient is alert and oriented.  He answers questions appropriately. Eyes -pupils equal and reactive.  Slightly pale conjunctiva Nose - normal and patent, no erythema, discharge or polyps Mouth -no oral lesions.  Poor oral care. Neck -supple.  Small single shotty lymph node on the left posterior chain.  No thyromegaly.  No thyroid nodules.  Prominent Adam's apple Lymphatics -no axillary lymphadenopathy Chest -breath sounds decreased on the right compared to the left.  Does have some atrophy of chest wall muscles on the right side Heart -regular rate rhythm, no gallops or murmurs.  PMI is displaced  neurological -patient has mild dysarthria.  Slightly decreased gag reflex.  Dense paresis in the right upper extremity and right lower extremity Extremities -no lower extremity edema Nails: Toenails are thick and overgrown  [facility] 06/02/2019  Decreased Interest 1  Down, Depressed, Hopeless 0  PHQ - 2 Score 1   GAD 7 :  Generalized Anxiety Score 06/02/2019  Nervous, Anxious, on Edge 1  Control/stop worrying 0  Worry too much - different things 1  Trouble relaxing 1  Restless 0  Easily annoyed or irritable 1  Afraid - awful might happen 0  Total GAD 7 Score 4     CMP Latest Ref Rng & Units 10/25/2015  10/22/2015 10/18/2015  Glucose 65 - 99 mg/dL 161(W112(H) 98 960(A146(H)  BUN 6 - 20 mg/dL 18 54(U21(H) 98(J22(H)  Creatinine 0.61 - 1.24 mg/dL 1.91(Y1.30(H) 7.82(N1.35(H) 5.62(Z1.41(H)  Sodium 135 - 145 mmol/L 136 137 139  Potassium 3.5 - 5.1 mmol/L 3.7 5.5(H) 3.8  Chloride 101 - 111 mmol/L 105 107 105  CO2 22 - 32 mmol/L 24 24 25   Calcium 8.9 - 10.3 mg/dL 8.9 9.3 9.6  Total Protein 6.5 - 8.1 g/dL 7.1 7.8 3.0(Q8.7(H)  Total Bilirubin 0.3 - 1.2 mg/dL 0.6 1.0 0.6  Alkaline Phos 38 - 126 U/L 108 118 128(H)  AST 15 - 41 U/L 34 55(H) 45(H)  ALT 17 - 63 U/L 23 34 38   CBC    Component Value Date/Time   WBC 9.9 10/25/2015 1754   RBC 3.86 (L) 10/25/2015 1754   HGB 11.7 (L) 10/25/2015 1754   HCT 35.6 (L) 10/25/2015 1754   PLT 380 10/25/2015 1754   MCV 92.2 10/25/2015 1754   MCH 30.3 10/25/2015 1754   MCHC 32.9 10/25/2015 1754   RDW 13.1 10/25/2015 1754   LYMPHSABS 2.2 10/25/2015 1754   MONOABS 0.7 10/25/2015 1754   EOSABS 0.7 10/25/2015 1754   BASOSABS 0.0 10/25/2015 1754    ASSESSMENT AND PLAN: 1. History of ischemic stroke without residual deficits -Patient currently on DAPT most likely due to history of CAD.  Discussed the importance of secondary prevention measures including good blood pressure control, smoking cessation. - baclofen (LIORESAL) 10 MG tablet; Take 1 tablet (10 mg total) by mouth 3 (three) times daily.  Dispense: 90 each; Refill: 5 - Ambulatory referral to Physical Therapy - Ambulatory referral to Occupational Therapy  2. Essential hypertension Not at goal.  Increase carvedilol to 12.5 mg twice a day.  Continue to limit salt in the foods - lisinopril (ZESTRIL) 10 MG tablet; Take 1 tablet (10 mg total) by mouth daily.   Dispense: 30 tablet; Refill: 6 - amLODipine (NORVASC) 10 MG tablet; Take 1 tablet (10 mg total) by mouth daily.  Dispense: 30 tablet; Refill: 6  3. Hyperlipidemia, unspecified hyperlipidemia type Continue atorvastatin  4. Coronary artery disease involving native coronary artery of native heart without angina pectoris Clinically stable. - CBC - Comprehensive metabolic panel - Lipid panel - carvedilol (COREG) 12.5 MG tablet; Take 1 tablet (12.5 mg total) by mouth 2 (two) times daily with a meal.  Dispense: 60 tablet; Refill: 5 - clopidogrel (PLAVIX) 75 MG tablet; Take 1 tablet (75 mg total) by mouth daily.  Dispense: 30 tablet; Refill: 6 - atorvastatin (LIPITOR) 40 MG tablet; Take 1 tablet (40 mg total) by mouth at bedtime.  Dispense: 30 tablet; Refill: 6 - aspirin EC 81 MG tablet; Take 1 tablet (81 mg total) by mouth daily.  Dispense: 100 tablet; Refill: 1  5. Tobacco dependence Patient advised to quit smoking. Discussed health risks associated with smoking including lung and other types of cancers, chronic lung diseases and CV risks.. Pt ready to give trail of quitting.  Discussed methods to help quit including quitting cold [place], use of NRT, Chantix and Bupropion.  He will try the nicotine gum.  Encouraged him to set a quit date.  Less than 5 minutes spent on counseling  6. Overgrown toenails - Ambulatory referral to Podiatry  7. Bowel and bladder incontinence My CMA will submit referral for his incontinence supplies  8. Decreased breath sounds at right lung base - DG Chest 2 View; Future  9. Homeless We will have our  caseworker follow-up with them  10. Vitamin D deficiency - VITAMIN D 25 Hydroxy (Vit-D Deficiency, Fractures)  35 minutes spent with patient and his son discussing diagnosis, treatment and helping to coordinate care Patient was given the opportunity to ask questions.  Patient verbalized understanding of the plan and was able to repeat key elements of the plan.    Orders Placed This Encounter  Procedures  . DG Chest 2 View  . CBC  . Comprehensive metabolic panel  . Lipid panel  . VITAMIN D 25 Hydroxy (Vit-D Deficiency, Fractures)  . Ambulatory referral to Physical Therapy  . Ambulatory referral to Occupational Therapy  . Ambulatory referral to Podiatry     Requested Prescriptions   Signed Prescriptions Disp Refills  . carvedilol (COREG) 12.5 MG tablet 60 tablet 5    Sig: Take 1 tablet (12.5 mg total) by mouth 2 (two) times daily with a meal.  . lisinopril (ZESTRIL) 10 MG tablet 30 tablet 6    Sig: Take 1 tablet (10 mg total) by mouth daily.  . baclofen (LIORESAL) 10 MG tablet 90 each 5    Sig: Take 1 tablet (10 mg total) by mouth 3 (three) times daily.  [name]. amLODipine (NORVASC) 10 MG tablet 30 tablet 6    Sig: Take 1 tablet (10 mg total) by mouth daily.  [name]. albuterol (VENTOLIN HFA) 108 (90 Base) MCG/ACT inhaler 18 g 6    Sig: Inhale 2 puffs into the lungs every 6 (six) hours as needed for wheezing or shortness of breath.  . clopidogrel (PLAVIX) 75 MG tablet 30 tablet 6    Sig: Take 1 tablet (75 mg total) by mouth daily.  [name]. atorvastatin (LIPITOR) 40 MG tablet 30 tablet 6    Sig: Take 1 tablet (40 mg total) by mouth at bedtime.  [name]. aspirin EC 81 MG tablet 100 tablet 1    Sig: Take 1 tablet (81 mg total) by mouth daily.    Return in about 7 weeks (around 07/21/2019).  Jonah Blueeborah , MD, FACP

## 2019-06-03 LAB — COMPREHENSIVE METABOLIC PANEL
ALT: 16 IU/L (ref 0–44)
AST: 24 IU/L (ref 0–40)
Albumin/Globulin Ratio: 1 — ABNORMAL LOW (ref 1.2–2.2)
Albumin: 4.2 g/dL (ref 3.8–4.8)
Alkaline Phosphatase: 123 IU/L — ABNORMAL HIGH (ref 39–117)
BUN/Creatinine Ratio: 11 (ref 10–24)
BUN: 13 mg/dL (ref 8–27)
Bilirubin Total: 0.7 mg/dL (ref 0.0–1.2)
CO2: 23 mmol/L (ref 20–29)
Calcium: 9.9 mg/dL (ref 8.6–10.2)
Chloride: 99 mmol/L (ref 96–106)
Creatinine, Ser: 1.22 mg/dL (ref 0.76–1.27)
GFR calc Af Amer: 74 mL/min/{1.73_m2} (ref >59)
GFR calc non Af Amer: 64 mL/min/{1.73_m2} (ref >59)
Globulin, Total: 4.1 g/dL (ref 1.5–4.5)
Glucose: 117 mg/dL — ABNORMAL HIGH (ref 65–99)
Potassium: 4.3 mmol/L (ref 3.5–5.2)
Sodium: 141 mmol/L (ref 134–144)
Total Protein: 8.3 g/dL (ref 6.0–8.5)

## 2019-06-03 LAB — CBC
Hematocrit: 43.4 % (ref 37.5–51.0)
Hemoglobin: 14.2 g/dL (ref 13.0–17.7)
MCH: 30 pg (ref 26.6–33.0)
MCHC: 32.7 g/dL (ref 31.5–35.7)
MCV: 92 fL (ref 79–97)
Platelets: 299 10*3/uL (ref 150–450)
RBC: 4.73 x10E6/uL (ref 4.14–5.80)
RDW: 12.1 % (ref 11.6–15.4)
WBC: 8.5 10*3/uL (ref 3.4–10.8)

## 2019-06-03 LAB — VITAMIN D 25 HYDROXY (VIT D DEFICIENCY, FRACTURES): Vit D, 25-Hydroxy: 33.6 ng/mL (ref 30.0–100.0)

## 2019-06-03 LAB — LIPID PANEL
Chol/HDL Ratio: 3.7 ratio (ref 0.0–5.0)
Cholesterol, Total: 231 mg/dL — ABNORMAL HIGH (ref 100–199)
HDL: 63 mg/dL (ref >39)
LDL Calculated: 152 mg/dL — ABNORMAL HIGH (ref 0–99)
Triglycerides: 81 mg/dL (ref 0–149)
VLDL Cholesterol Cal: 16 mg/dL (ref 5–40)

## 2019-06-04 ENCOUNTER — Other Ambulatory Visit: Payer: Self-pay | Admitting: Internal Medicine

## 2019-06-04 MED ORDER — ERGOCALCIFEROL 1.25 MG (50000 UT) PO CAPS: 50000.0000 [IU] | ORAL_CAPSULE | ORAL | 1 refills | Status: AC

## 2019-06-05 ENCOUNTER — Telehealth: Payer: Self-pay

## 2019-06-05 NOTE — Telephone Encounter (Signed)
Attempted to contact patient # [phone] at request of Dr Wynetta Emery.  Patient in need of housing resources.  Message left requesting a call back to this CM # [phone]

## 2019-06-06 ENCOUNTER — Telehealth: Payer: Self-pay

## 2019-06-06 NOTE — Telephone Encounter (Signed)
Contacted pt son to go over lab results pt son doesn't have any questions or concerns  Dr. Wynetta Emery pt son states pt was out of liptor but he make sure pt takes medication everyday

## 2019-06-06 NOTE — Telephone Encounter (Signed)
Call placed to patient at request of Dr Wynetta Emery to discuss housing needs. His son, Duane Boston explained that he and his father were living in a shelter in [place] but are now living in a house with Quran's wife and daughter. He explained that their housing is stable and they plan to eventually move to [place] where the patient grew up. The house that they plan to move to is undergoing renovations at this time.  Quran was inquiring about being paid to care for his father. He said that this was an option for him in [place].  This CM explained about the CAP program and Quran stated that has already contacted Alicia Amel at the program and is waiting for an application. He is aware that the wait list at this time is about 6 months.   This CM also explained about PCS but he would not be paid as a caregiver. Quran stated that he would discuss with his father.   The patient has medicaid and receives disability. Quran said that the disability income is being adjusted to [place] minimum.  Duane Boston is requesting adult diaper - size L for his father. Informed him that Dr Wynetta Emery would be notified.  The order can be sent to [employer] for home delivery.

## 2019-06-07 MED ORDER — MISC. DEVICES MISC: 6 refills | Status: AC

## 2019-06-07 NOTE — Telephone Encounter (Signed)
Rx for Diaper done.

## 2019-06-07 NOTE — Telephone Encounter (Signed)
No change in dose needs to be made at this time given he had been out for Lipitor in the past.  Continue current dose, low-cholesterol diet and exercise.

## 2019-06-07 NOTE — Telephone Encounter (Signed)
Form for [employer] was faxed last week

## 2019-06-08 ENCOUNTER — Telehealth: Payer: Self-pay | Admitting: Internal Medicine

## 2019-06-08 NOTE — Telephone Encounter (Signed)
Contacted pt son to go over Dr. Margarita Rana response pt son didn't answer left a detailed vm and if he has any questions or concerns to give me a call

## 2019-06-08 NOTE — Telephone Encounter (Signed)
Call returned to patient's son, Duane Boston. He explained that he wanted to sit with this CM to complete the CAP application.  Informed him that he can complete all the information that he knows and then drop the application off at [facility] for this CM or Christa See, LCSW to review.  We can attach the medication list and complete diagnoses. We will contact him with any questions and then will fax the completed application to CAP .  He was very appreciative of the help.

## 2019-06-08 NOTE — Telephone Encounter (Signed)
Quran, son, called wanting to speak to Opal Sidles about pt. Opal Sidles please return call to [phone]. He wants to sit in person with you to talk and fill out a form.

## 2019-06-09 ENCOUNTER — Ambulatory Visit (INDEPENDENT_AMBULATORY_CARE_PROVIDER_SITE_OTHER): Payer: Medicaid Other | Admitting: Podiatry

## 2019-06-09 ENCOUNTER — Other Ambulatory Visit: Payer: Self-pay

## 2019-06-09 VITALS — BP 157/89 | HR 61 | Temp 97.2°F

## 2019-06-09 DIAGNOSIS — B351 Tinea unguium: Secondary | ICD-10-CM

## 2019-06-09 DIAGNOSIS — M79676 Pain in unspecified toe(s): Secondary | ICD-10-CM | POA: Diagnosis not present

## 2019-06-09 DIAGNOSIS — D689 Coagulation defect, unspecified: Secondary | ICD-10-CM | POA: Diagnosis not present

## 2019-06-09 NOTE — Progress Notes (Signed)
Subjective:  Patient ID: Francisco HedgerDaniel Sciuto, male    DOB: 1956/06/09,  MRN: [id number]  Chief Complaint  Patient presents with  . Nail Problem    Left 1st entire nail is painful, denies drainage/fever/nausea/vomiting/chills.    63 y.o. male presents with the above complaint.  Reports painfully elongated nails to both feet. Hx of embolic stroke affecting right side.   Review of Systems: Negative except as noted in the HPI. Denies N/V/F/Ch.  Past Medical History:  Diagnosis Date  . CHF (congestive heart failure) (HCC)   . Coronary artery disease   . Stroke [facility](HCC)     Current Outpatient Medications:  .  albuterol (VENTOLIN HFA) 108 (90 Base) MCG/ACT inhaler, Inhale 2 puffs into the lungs every 6 (six) hours as needed for wheezing or shortness of breath., Disp: 18 g, Rfl: 6 .  amLODipine (NORVASC) 10 MG tablet, Take 1 tablet (10 mg total) by mouth daily., Disp: 30 tablet, Rfl: 6 .  aspirin 81 MG tablet, Take 1 tablet (81 mg total) by mouth daily., Disp: 20 tablet, Rfl: 0 .  aspirin EC 81 MG tablet, Take 1 tablet (81 mg total) by mouth daily., Disp: 100 tablet, Rfl: 1 .  atorvastatin (LIPITOR) 40 MG tablet, Take 1 tablet (40 mg total) by mouth at bedtime., Disp: 30 tablet, Rfl: 6 .  baclofen (LIORESAL) 10 MG tablet, Take 1 tablet (10 mg total) by mouth 3 (three) times daily., Disp: 90 each, Rfl: 5 .  carvedilol (COREG) 12.5 MG tablet, Take 1 tablet (12.5 mg total) by mouth 2 (two) times daily with a meal., Disp: 60 tablet, Rfl: 5 .  clopidogrel (PLAVIX) 75 MG tablet, Take 1 tablet (75 mg total) by mouth daily., Disp: 30 tablet, Rfl: 6 .  ergocalciferol (VITAMIN D2) 1.25 MG (50000 UT) capsule, Take 1 capsule (50,000 Units total) by mouth once a week., Disp: 12 capsule, Rfl: 1 .  folic acid (FOLVITE) 1 MG tablet, Take 1 mg by mouth daily., Disp: , Rfl:  .  lisinopril (ZESTRIL) 10 MG tablet, Take 1 tablet (10 mg total) by mouth daily., Disp: 30 tablet, Rfl: 6 .  Misc. Devices MISC, Adult sized  diaper-large.  Dispense 1 box with refills. Dx: bowel and bladder incontinence, Disp: 1 each, Rfl: 6 .  nicotine polacrilex (NICORETTE) 2 MG gum, Take 1 each (2 mg total) by mouth as needed for smoking cessation., Disp: 100 tablet, Rfl: 0  Social History   Tobacco Use  Smoking Status Current Every Day Smoker  . Packs/day: 0.25  . Years: 45.00  . Pack years: 11.25  Smokeless Tobacco Never Used    No Known Allergies Objective:   Vitals:   06/09/19 0838  BP: (!) 157/89  Pulse: 61  Temp: (!) 97.2 F (36.2 C)   There is no height or weight on file to calculate BMI. Constitutional Well developed. Well nourished.  Vascular Dorsalis pedis pulses palpable bilaterally. Posterior tibial pulses palpable bilaterally. Capillary refill normal to all digits.  No cyanosis or clubbing noted. Pedal hair growth normal.  Neurologic Normal speech. Oriented to person, place, and time. Epicritic sensation to light touch grossly present left, diminished right.  Dermatologic Nails elongated dystrophic pain to palpation No open wounds. No skin lesions.  Orthopedic: Normal joint ROM without pain or crepitus bilaterally. [place] bilat. No bony tenderness.   Radiographs: None Assessment:   1. Pain due to onychomycosis of nail   2. Coagulation defect [facility](HCC)    Plan:  Patient was evaluated and treated  and all questions answered.  Onychomycosis with pain -Nails palliatively debridement as below -Educated on self-care  Procedure: Nail Debridement Rationale: Pain Type of Debridement: manual, sharp debridement. Instrumentation: Nail nipper, rotary burr. Number of Nails: 10    Return in about 3 months (around 09/09/2019).

## 2019-06-14 ENCOUNTER — Telehealth: Payer: Self-pay | Admitting: Internal Medicine

## 2019-06-14 ENCOUNTER — Telehealth: Payer: Self-pay | Admitting: Licensed Clinical Social Worker

## 2019-06-14 NOTE — Telephone Encounter (Signed)
Patient son called to confirm if paperwork was received. Please follow up.

## 2019-06-14 NOTE — Telephone Encounter (Signed)
LCSW emailed completed service request for CAP services to [employer]. LCSW received receipt of confirmation.

## 2019-06-14 NOTE — Telephone Encounter (Signed)
LCSW placed call to patient. Spoke with his son, Duane Boston and informed him that completed application for CAP services has been received by DSS.

## 2019-06-21 ENCOUNTER — Other Ambulatory Visit: Payer: Self-pay

## 2019-06-21 ENCOUNTER — Encounter: Payer: Self-pay | Admitting: Physical Therapy

## 2019-06-21 ENCOUNTER — Ambulatory Visit: Payer: Medicaid Other | Admitting: Occupational Therapy

## 2019-06-21 ENCOUNTER — Ambulatory Visit: Payer: Medicaid Other | Attending: Internal Medicine | Admitting: Physical Therapy

## 2019-06-21 DIAGNOSIS — R262 Difficulty in walking, not elsewhere classified: Secondary | ICD-10-CM | POA: Diagnosis present

## 2019-06-21 DIAGNOSIS — M25511 Pain in right shoulder: Secondary | ICD-10-CM | POA: Insufficient documentation

## 2019-06-21 DIAGNOSIS — I69351 Hemiplegia and hemiparesis following cerebral infarction affecting right dominant side: Secondary | ICD-10-CM | POA: Diagnosis present

## 2019-06-21 DIAGNOSIS — R2681 Unsteadiness on feet: Secondary | ICD-10-CM

## 2019-06-21 DIAGNOSIS — G8929 Other chronic pain: Secondary | ICD-10-CM | POA: Diagnosis present

## 2019-06-21 NOTE — Therapy (Signed)
[facility] [address] [place], [place], [address] Phone: [phone]   Fax:  [phone]  Occupational Therapy Evaluation  Patient Details  Name: Francisco HedgerDaniel Obrien MRN: [id number] Date of Birth: 15-May-1956 Referring Provider (OT): Dr. Jonah Blueeborah Johnson   Encounter Date: 06/21/2019  OT End of Session - 06/21/19 1017    Visit Number  1    Number of Visits  11    Date for OT Re-Evaluation  08/21/19    Authorization Type  MCD - awaiting authorization    OT Start Time  0930    OT Stop Time  1010    OT Time Calculation (min)  40 min    Activity Tolerance  Patient tolerated treatment well       Past Medical History:  Diagnosis Date  . CHF (congestive heart failure) (HCC)   . Coronary artery disease   . Stroke [facility](HCC)     No past surgical history on file.  There were no vitals filed for this visit.  Subjective Assessment - 06/21/19 0939    Subjective   Son reports pt does sometimes complain of Rt shoulder pain, but not today    Patient is accompanied by:  Family member   son   Pertinent History  PMH: CVA 2016 w/ residual Rt hemiparesis, CVA 2013 w/ no residual deficits, CAD (w/ 2 stents), CHF, HTN    Limitations  fall risk, stents, aphasic, no driving    Patient Stated Goals  more mobility    Currently in Pain?  No/denies        [facility] - 06/21/19 0001      Assessment   Medical Diagnosis  Rt hemiparesis (from CVA in 2016)    Referring Provider (OT)  Dr. Jonah Blueeborah Johnson    Onset Date/Surgical Date  --   Nov 2016   Hand Dominance  Right   but has used Lt hand since stroke   Prior Therapy  acute, SNF, home health      Precautions   Precautions  Other (comment);Fall    Precaution Comments  stents, no driving      Balance Screen   Has the patient fallen in the past 6 months  No      Home  Environment   Bathroom Shower/Tub  Tub/Shower unit   but pt getting spong baths from son   Home Equipment   Cane -quad;Wheelchair - manual    Additional Comments  Pt lives w/ son in townhome w/ 1 step to enter, but bedrooms/bathrooms on 2nd floor.     Lives With  Son      Prior Function   Level of Independence  Independent    Vocation  On disability      ADL   Eating/Feeding  Needs assist with cutting food   uses Lt hand   Grooming  --   assist for shaving only   Upper Body Bathing  Moderate assistance    Lower Body Bathing  Moderate assistance    Upper Body Dressing  Minimal assistance   Pt can do w/ great effort but takes longer   Lower Body Dressing  Min guard    Toilet Transfer  --   does not use d/t incontinence & bathroom on 2nd floor   Tub/Shower Transfer  --   n/a d/t on 2nd floor   ADL comments  pt can get snack from w/c level. Son performing all other IADLS      Mobility  Mobility Status Comments  W/C mostly, short distances w/ quad cane      Written Expression   Dominant Hand  Right    Handwriting  --   using Lt hand, print     Vision - History   Additional Comments  unable to assess formally but appears functional       Cognition   Overall Cognitive Status  Impaired/Different from baseline   impaired since stroke in 2016   Cognition Comments  Aphasia noted. Son had to answer questions during evaluation. Pt could follow simple demo and/or verbal 1 step commands      Observation/Other Assessments   Observations  Pt with Rt hemiparesis since 2016. Poor posture, limited trunk/scapula movement, aphasic      Posture/Postural Control   Posture Comments  posterior pelvic tilt, rounded shoulders      Sensation   Light Touch  Impaired by gross assessment      Coordination   Coordination  unable to use RUE functionally      Tone   Assessment Location  Right Upper Extremity      ROM / Strength   AROM / PROM / Strength  AROM;PROM      AROM   Overall AROM Comments  Pt only has min scapula elevation and elbow flexion. No other movement      PROM   Overall PROM  Comments  Pt can tolerate P/ROM to Rt shoulder to approx 110*, and abduction b/t 90-100*. Elbow flex/ext limited d/t spasticity. Full finger extension limited d/t spasticity but increases w/ wrist flexion      RUE Tone   RUE Tone  Modified Ashworth      RUE Tone   Modified Ashworth Scale for Grading Hypertonia RUE  Considerable increase in muschle tone, passive movement difficult                           OT Long Term Goals - 06/21/19 1025      OT LONG TERM GOAL #1   Title  Pt/family Independent with splint wear and care    Baseline  dependent/not yet issued    Status  New      OT LONG TERM GOAL #2   Title  Pt/family ndependent with HEP for RUE    Baseline  Dependent/not issued yet    Status  New      OT LONG TERM GOAL #3   Title  Pt/family will verbalize understanding w/ safety considerations due to lack of sensation Rt side    Baseline  dependent    Status  New      OT LONG TERM GOAL #4   Title  Pt/family will verbalize understanding w/ potential A/E and DME needs to increase ease, safety, and independence with ADLS    Baseline  Dependent    Status  New      OT LONG TERM GOAL #5   Title  Pt will consistently be only direct supervision for dressing, and min assist for bathing (after set up)    Baseline  min assist for dressing, mod assist for bathing    Status  New      Long Term Additional Goals   Additional Long Term Goals  Yes      OT LONG TERM GOAL #6   Title  Pt will perform toilet transfer using 3-in-1 commode w/ min assist for clothes management    Baseline  dependent - son performing (using  diapers)    Status  New            Plan - 06/21/19 1020    Clinical Impression Statement  Pt is a 63 y.o. male who presents to outpatient rehab with spastic dominant side Rt hemiplegia from CVA in 2016. Pt would benefit from O.T. to increase safety and independence with ADLS, splinting, and proper stretching of RUE, as well as caregiver education.  Pt also has aphasia and cognitive deficits but will not address this d/t outside scope of practice and do not anticipate any progress in this.    Occupational performance deficits (Please refer to evaluation for details):  ADL's    Body Structure / Function / Physical Skills  ADL;IADL;Sensation;Improper spinal/pelvic alignment;Strength;Muscle spasms;UE functional use;Pain;Proprioception;Decreased knowledge of use of DME;ROM;Mobility    Rehab Potential  Good   for goals set   Clinical Decision Making  Several treatment options, min-mod task modification necessary    Comorbidities Affecting Occupational Performance:  May have comorbidities impacting occupational performance    Modification or Assistance to Complete Evaluation   Min-Moderate modification of tasks or assist with assess necessary to complete eval    OT Frequency  1x / week   for 3 visits (first month due to MCD), then will ask for additional 2x/wk for 4 weeks   OT Treatment/Interventions  Self-care/ADL training;Therapeutic exercise;Functional Mobility Training;Neuromuscular education;Manual Therapy;Splinting;Therapeutic activities;DME and/or AE instruction;Moist Heat;Passive range of motion;Patient/family education    Plan  resting hand splint (following session: initiate HEP and discuss safety d/t lack of sensation RT side)    Consulted and Agree with Plan of Care  Patient;Family member/caregiver    Family Member Consulted  son       Patient will benefit from skilled therapeutic intervention in order to improve the following deficits and impairments:   Body Structure / Function / Physical Skills: ADL, IADL, Sensation, Improper spinal/pelvic alignment, Strength, Muscle spasms, UE functional use, Pain, Proprioception, Decreased knowledge of use of DME, ROM, Mobility       Visit Diagnosis: 1. Hemiplegia and hemiparesis following cerebral infarction affecting right dominant side ([place])   2. Chronic right shoulder pain   3.  Unsteadiness on feet       Problem List Patient Active Problem List   Diagnosis Date Noted  . History of ischemic stroke without residual deficits 06/02/2019  . Essential hypertension 06/02/2019  . Hyperlipidemia 06/02/2019  . Coronary artery disease involving native coronary artery of native heart without angina pectoris 06/02/2019  . Tobacco dependence 06/02/2019  . Overgrown toenails 06/02/2019  . Bowel and bladder incontinence 06/02/2019  . Decreased breath sounds at right lung base 06/02/2019  . Homeless 06/02/2019  . Vitamin D deficiency 06/02/2019    Carey Bullocks, OTR/L 06/21/2019, 10:32 AM  [facility] [address] [place], [place], [address] Phone: [phone]   Fax:  [phone]  Name: Roosevelt Eimers MRN: [id number] Date of Birth: Nov 14, 1956

## 2019-06-21 NOTE — Patient Instructions (Signed)
Access Code: [email or web address]  URL: [email or web address]  Date: 06/21/2019  Prepared by: Elsie Ra   Exercises  Side to Side Weight Shift with Counter Support - 10 reps - 3 sets - 2x daily - 6x weekly  Staggered Stance Forward Backward Weight Shift with Counter Support - 10 reps - 3 sets - 2x daily - 6x weekly  Sit to Stand with Counter Support - 10 reps - 3 sets - 2x daily - 6x weekly  Standing with Head Rotation - 10 reps - 3 sets - 2x daily - 6x weekly

## 2019-06-21 NOTE — Therapy (Signed)
[facility] [address] [place], [place], [address] Phone: [phone]   Fax:  [phone]  Physical Therapy Evaluation  Patient Details  Name: Francisco Obrien MRN: [id number] Date of Birth: [birth year] No data recorded  Encounter Date: 06/21/2019  PT End of Session - 06/21/19 1311    Visit Number  1    Number of Visits  12    Date for PT Re-Evaluation  09/13/19    Authorization Type  MCD asking for 12 visits    PT Start Time  1015    PT Stop Time  1100    PT Time Calculation (min)  45 min    Activity Tolerance  Patient tolerated treatment well    Behavior During Therapy  [facility] for tasks assessed/performed       Past Medical History:  Diagnosis Date  . CHF (congestive heart failure) (HCC)   . Coronary artery disease   . Stroke [facility](HCC)     History reviewed. No pertinent surgical history.  There were no vitals filed for this visit.   Subjective Assessment - 06/21/19 1121    Subjective  Relays CVA 2016 with Rt hemiparesis, he was getting PT services at SNF in [place] after CVA then last therapy was in nov 2019 in [place]. He gets help from son with some ADL's but he can transfer independently and ambulate small distances with hemiwalker. He denies any pain.    Currently in Pain?  No/denies         [facility] - 06/21/19 1123      Assessment   Medical Diagnosis  Rt hemiparesis (from CVA in 2016)    Onset Date/Surgical Date  --   Nov 2016   Hand Dominance  Right   but has used Lt hand since stroke   Prior Therapy  acute, SNF, home health      Precautions   Precautions  Other (comment);Fall    Precaution Comments  stents, no driving      Prior Function   Level of Independence  Independent    Vocation  On disability      Sensation   Light Touch  Impaired by gross assessment      Posture/Postural Control   Posture Comments  posterior pelvic tilt, rounded shoulders      ROM / Strength   AROM /  PROM / Strength  AROM;Strength      AROM   Overall AROM Comments  Pt only has min scapula elevation and elbow flexion on Rt. No other movement, For LE he has min hip flexion and circumduction but no other movement      PROM   Overall PROM Comments  Pt can tolerate P/ROM to Rt shoulder to approx 110*, and abduction b/t 90-100*. Elbow flex/ext limited d/t spasticity. Full finger extension limited d/t spasticity but increases w/ wrist flexion      Strength   Overall Strength Comments  Lt UE/RE 5/5, Rt side only has 2/5 elbow flexion, 3/5 shoulder elevation, no other movement in UE, then for LE only has 2/5 hip flexion and abd but no other active movement      Transfers   Transfers  Sit to Stand;Stand to Sit;Stand Pivot Transfers    Sit to Stand  5: Supervision    Sit to Stand Details (indicate cue type and reason)  needs Lt arm to push up from arm rest    Stand to Sit  5: Supervision    Stand  to Sit Details  needs to use armrest to lower safely    Stand Pivot Transfers  5: Supervision    Stand Pivot Transfer Details (indicate cue type and reason)  needs hemiwalker, supervision and CGA for safety    Number of Reps  2 sets      Ambulation/Gait   Ambulation/Gait  Yes    Ambulation/Gait Assistance  5: Supervision    Ambulation/Gait Assistance Details  with CGA     Ambulation Distance (Feet)  30 Feet    Assistive device  Hemi-walker    Gait Pattern  Decreased arm swing - right;Decreased step length - right;Decreased step length - left;Decreased hip/knee flexion - right;Right circumduction    Ambulation Surface  Level;Indoor    Gait velocity  39 sec for 10 ft                Objective measurements completed on examination: See above findings.              PT Education - 06/21/19 1311    Education Details  HEP, POC    Person(s) Educated  Patient;Child(ren)    Methods  Explanation;Demonstration;Verbal cues;Handout    Comprehension  Verbalized understanding;Need further  instruction       PT Short Term Goals - 06/21/19 1324      PT SHORT TERM GOAL #1   Title  Pt will be I and compliant with HEP. (6 weeks 08/02/19)    Status  New      PT SHORT TERM GOAL #2   Title  Pt will decrease 5TSTS time to less than 30 sec    Baseline  35 sec    Status  New        PT Long Term Goals - 06/21/19 1326      PT LONG TERM GOAL #1   Title  Pt will decrease 5TSTS test to less than 25 sec. (Target for all LTG's 12 weeks 09/13/19)    Baseline  35 sec    Status  New      PT LONG TERM GOAL #2   Title  Pt will be able to ambulate at least 110 ft with hemiwalker.    Baseline  30 ft with hemiwalker    Status  New      PT LONG TERM GOAL #3   Title  He will improrve gait speed for 10 ft to less than 30 seconds    Baseline  39 sec    Status  New             Plan - 06/21/19 1313    Clinical Impression Statement  Pt presents with Rt hemiparesis after CVA in 2016. He has very limited function on his Rt sided but has good strength on his Lt side. He can transfer with supervision and ambulate with himi walker short distances with supervision/CGA. He has overall decreased balance, and and limited functional ability for transfers and gait. He will benefit from skilled PT to address these deficits.    Personal Factors and Comorbidities  Comorbidity 1;Comorbidity 2;Comorbidity 3+    Comorbidities  Patient with history of CHF, HTN, CAD (2 stents in 2013 and 2003), CVA  (09/2016) with right hemiparesis, incont of urine and bowel,  tob depen, vit D def    Examination-Activity Limitations  Bathing;Locomotion Level;Transfers;Stairs;Stand;Bed Mobility    Examination-Participation Restrictions  Cleaning;Community Activity;Shop;Laundry    Stability/Clinical Decision Making  Evolving/Moderate complexity    Clinical Decision Making  Moderate  Rehab Potential  Good    PT Frequency  Other (comment)   1-2   PT Duration  12 weeks    PT Treatment/Interventions  ADLs/Self Care Home  Management;DME Instruction;Gait training;Stair training;Functional mobility training;Therapeutic activities;Therapeutic exercise;Balance training;Neuromuscular re-education;Manual techniques;Passive range of motion    PT Next Visit Plan  review HEP, needs gait and functional mobility and balance    PT Home Exercise Plan  weight shifting, balance with head turns, sit to stands    Consulted and Agree with Plan of Care  Patient;Family member/caregiver    Family Member Consulted  son       Patient will benefit from skilled therapeutic intervention in order to improve the following deficits and impairments:  Abnormal gait, Decreased activity tolerance, Decreased balance, Decreased mobility, Decreased endurance, Decreased range of motion, Decreased strength, Difficulty walking, Obesity  Visit Diagnosis: 1. Hemiplegia and hemiparesis following cerebral infarction affecting right dominant side (HCC)   2. Difficulty in walking, not elsewhere classified        Problem List Patient Active Problem List   Diagnosis Date Noted  . History of ischemic stroke without residual deficits 06/02/2019  . Essential hypertension 06/02/2019  . Hyperlipidemia 06/02/2019  . Coronary artery disease involving native coronary artery of native heart without angina pectoris 06/02/2019  . Tobacco dependence 06/02/2019  . Overgrown toenails 06/02/2019  . Bowel and bladder incontinence 06/02/2019  . Decreased breath sounds at right lung base 06/02/2019  . Homeless 06/02/2019  . Vitamin D deficiency 06/02/2019    Birdie RiddleBrian R Nelson,PT,DPT 06/21/2019, 1:30 PM  [facility] [address] [place], [place], [address] Phone: [phone]   Fax:  [phone]  Name: Francisco Obrien MRN: [id number] Date of Birth: 03-23-1956

## 2019-07-03 ENCOUNTER — Ambulatory Visit: Payer: Medicaid Other | Admitting: Physical Therapy

## 2019-07-03 ENCOUNTER — Other Ambulatory Visit: Payer: Self-pay

## 2019-07-03 ENCOUNTER — Ambulatory Visit: Payer: Medicaid Other | Admitting: Occupational Therapy

## 2019-07-03 DIAGNOSIS — I69351 Hemiplegia and hemiparesis following cerebral infarction affecting right dominant side: Secondary | ICD-10-CM

## 2019-07-03 DIAGNOSIS — R262 Difficulty in walking, not elsewhere classified: Secondary | ICD-10-CM

## 2019-07-03 DIAGNOSIS — R2681 Unsteadiness on feet: Secondary | ICD-10-CM

## 2019-07-03 NOTE — Patient Instructions (Signed)
Your Splint This splint should initially be fitted by a healthcare practitioner.  The healthcare practitioner is responsible for providing wearing instructions and precautions to the patient, other healthcare practitioners and care provider involved in the patient's care.  This splint was custom made for you. Please read the following instructions to learn about wearing and caring for your splint.  Precautions Should your splint cause any of the following problems, remove the splint immediately and contact your therapist/physician.  Swelling  Severe Pain  Pressure Areas  Stiffness  Numbness  Do not wear your splint while operating machinery unless it has been fabricated for that purpose.  When To Wear Your Splint Where your splint according to your therapist/physician instructions. Nighttime (Gradually build up tolerance over next 2 days while awake before switching to night time)   Care and Cleaning of Your Splint 1. Keep your splint away from open flames. 2. Your splint will lose its shape in temperatures over 135 degrees Farenheit, ( in car windows, near radiators, ovens or in hot water).  Never make any adjustments to your splint, if the splint needs adjusting remove it and make an appointment to see your therapist. 3. Your splint may be cleaned with rubbing alcohol.  Do not immerse in hot water over 135 degrees Farenheit.

## 2019-07-03 NOTE — Therapy (Signed)
[facility] [address] [place], [place], [address] Phone: [phone]   Fax:  [phone]  Physical Therapy Treatment  Patient Details  Name: Francisco Obrien MRN: [id number] Date of Birth: May 30, 1956 No data recorded  Encounter Date: 07/03/2019  PT End of Session - 07/03/19 1220    Visit Number  2    Number of Visits  12    Date for PT Re-Evaluation  09/13/19    Authorization Type  MCD asking for 12 visits    Authorization - Visit Number  1    Authorization - Number of Visits  12    PT Start Time  1020    PT Stop Time  1100    PT Time Calculation (min)  40 min    Activity Tolerance  Patient tolerated treatment well    Behavior During Therapy  [facility] for tasks assessed/performed       Past Medical History:  Diagnosis Date  . CHF (congestive heart failure) ([place])   . Coronary artery disease   . Stroke [facility])     No past surgical history on file.  There were no vitals filed for this visit.  Subjective Assessment - 07/03/19 1202    Subjective  Relays no pain, but has not done his exercises yet                       [facility] Adult PT Treatment/Exercise - 07/03/19 0001      Transfers   Transfers  Sit to Stand;Stand to Sit;Stand Pivot Transfers    Sit to Stand  5: Supervision    Sit to Stand Details (indicate cue type and reason)  needs Lt arm to push up, 10 times total during session      Ambulation/Gait   Ambulation/Gait  Yes    Ambulation/Gait Assistance  5: Supervision    Ambulation/Gait Assistance Details  with CGA    Ambulation Distance (Feet)  30 Feet   X 3 with trial of theraband to simulate AFO brace.      Exercises   Exercises  Other Exercises    Other Exercises   standing LE weight shifts with manual facilitation onto Rt leg   X20 latreal, and A-P. Then UE weight shifting down onto Rt elbow X 20 all at counter with min A.                PT Short Term Goals - 06/21/19 1324      PT SHORT TERM GOAL #1   Title  Pt will be I and compliant with HEP. (6 weeks 08/02/19)    Status  New      PT SHORT TERM GOAL #2   Title  Pt will decrease 5TSTS time to less than 30 sec    Baseline  35 sec    Status  New        PT Long Term Goals - 06/21/19 1326      PT LONG TERM GOAL #1   Title  Pt will decrease 5TSTS test to less than 25 sec. (Target for all LTG's 12 weeks 09/13/19)    Baseline  35 sec    Status  New      PT LONG TERM GOAL #2   Title  Pt will be able to ambulate at least 110 ft with hemiwalker.    Baseline  30 ft with hemiwalker    Status  New      PT LONG TERM  GOAL #3   Title  He will improrve gait speed for 10 ft to less than 30 seconds    Baseline  39 sec    Status  New            Plan - 07/03/19 1221    Clinical Impression Statement  Session focused on Rt LE strength and balance with sit to stands and  weight shifting. He was then trialed with makeshift AFO brace using theraband to pull foot into DF and this did appear to improve gait speed and foot clearance a little bit. PT will reach out to [facility] about orthothic consult for him as he will likely benefit from Rt leg brace.    Personal Factors and Comorbidities  Comorbidity 1;Comorbidity 2;Comorbidity 3+    Comorbidities  Patient with history of CHF, HTN, CAD (2 stents in 2013 and 2003), CVA  (09/2016) with right hemiparesis, incont of urine and bowel,  tob depen, vit D def    Examination-Activity Limitations  Bathing;Locomotion Level;Transfers;Stairs;Stand;Bed Mobility    Examination-Participation Restrictions  Cleaning;Community Activity;Shop;Laundry    Stability/Clinical Decision Making  Evolving/Moderate complexity    Rehab Potential  Good    PT Frequency  Other (comment)   1-2   PT Duration  12 weeks    PT Treatment/Interventions  ADLs/Self Care Home Management;DME Instruction;Gait training;Stair training;Functional mobility training;Therapeutic activities;Therapeutic exercise;Balance  training;Neuromuscular re-education;Manual techniques;Passive range of motion    PT Next Visit Plan  review HEP, needs gait and functional mobility and balance    PT Home Exercise Plan  weight shifting, balance with head turns, sit to stands    Consulted and Agree with Plan of Care  Patient;Family member/caregiver    Family Member Consulted  son       Patient will benefit from skilled therapeutic intervention in order to improve the following deficits and impairments:  Abnormal gait, Decreased activity tolerance, Decreased balance, Decreased mobility, Decreased endurance, Decreased range of motion, Decreased strength, Difficulty walking, Obesity  Visit Diagnosis: 1. Unsteadiness on feet   2. Difficulty in walking, not elsewhere classified   3. Hemiplegia and hemiparesis following cerebral infarction affecting right dominant side [facility](HCC)        Problem List Patient Active Problem List   Diagnosis Date Noted  . History of ischemic stroke without residual deficits 06/02/2019  . Essential hypertension 06/02/2019  . Hyperlipidemia 06/02/2019  . Coronary artery disease involving native coronary artery of native heart without angina pectoris 06/02/2019  . Tobacco dependence 06/02/2019  . Overgrown toenails 06/02/2019  . Bowel and bladder incontinence 06/02/2019  . Decreased breath sounds at right lung base 06/02/2019  . Homeless 06/02/2019  . Vitamin D deficiency 06/02/2019    Birdie RiddleBrian R Locklyn Henriquez,PT,DPT 07/03/2019, 12:34 PM  [place] [facility] [address] [place], [place], [address] Phone: [phone]   Fax:  [phone]  Name: Levon HedgerDaniel Hegner MRN: [id number] Date of Birth: 08-11-1956

## 2019-07-03 NOTE — Therapy (Signed)
[facility] [address] [place], [place], [address] Phone: [phone]   Fax:  [phone]  Occupational Therapy Treatment  Patient Details  Name: Francisco Obrien MRN: [id number] Date of Birth: August 24, 1956 Referring Provider (OT): Dr. Jonah Blueeborah Johnson   Encounter Date: 07/03/2019  OT End of Session - 07/03/19 1010    Visit Number  2    Number of Visits  11    Date for OT Re-Evaluation  08/21/19    Authorization Type  MCD    Authorization Time Period  Approved 3 visits from 8/17 - 07/23/19    Authorization - Visit Number  1    Authorization - Number of Visits  3    OT Start Time  0930    OT Stop Time  1012    OT Time Calculation (min)  42 min    Activity Tolerance  Patient tolerated treatment well    Behavior During Therapy  [facility] for tasks assessed/performed       Past Medical History:  Diagnosis Date  . CHF (congestive heart failure) ([facility])   . Coronary artery disease   . Stroke [facility](HCC)     No past surgical history on file.  There were no vitals filed for this visit.  Subjective Assessment - 07/03/19 0933    Patient is accompanied by:  Family member   son   Pertinent History  PMH: CVA 2016 w/ residual Rt hemiparesis, CVA 2013 w/ no residual deficits, CAD (w/ 2 stents), CHF, HTN    Limitations  fall risk, stents, aphasic, no driving    Patient Stated Goals  more mobility    Currently in Pain?  No/denies       Fabricated and fitted Rt resting hand splint and issued.                     OT Education - 07/03/19 1009    Education Details  splint wear and care    Person(s) Educated  Patient;Child(ren)   son   Methods  Explanation;Handout    Comprehension  Verbalized understanding          OT Long Term Goals - 07/03/19 1011      OT LONG TERM GOAL #1   Title  Pt/family Independent with splint wear and care    Baseline  dependent/not yet issued    Status  On-going      OT LONG TERM GOAL  #2   Title  Pt/family ndependent with HEP for RUE    Baseline  Dependent/not issued yet    Status  New      OT LONG TERM GOAL #3   Title  Pt/family will verbalize understanding w/ safety considerations due to lack of sensation Rt side    Baseline  dependent    Status  New      OT LONG TERM GOAL #4   Title  Pt/family will verbalize understanding w/ potential A/E and DME needs to increase ease, safety, and independence with ADLS    Baseline  Dependent    Status  New      OT LONG TERM GOAL #5   Title  Pt will consistently be only direct supervision for dressing, and min assist for bathing (after set up)    Baseline  min assist for dressing, mod assist for bathing    Status  New      OT LONG TERM GOAL #6   Title  Pt will perform toilet transfer using  3-in-1 commode w/ min assist for clothes management    Baseline  dependent - son performing (using diapers)    Status  New            Plan - 07/03/19 1011    Clinical Impression Statement  Pt is progressing towards LTG #1    Occupational performance deficits (Please refer to evaluation for details):  ADL's    Body Structure / Function / Physical Skills  ADL;IADL;Sensation;Improper spinal/pelvic alignment;Strength;Muscle spasms;UE functional use;Pain;Proprioception;Decreased knowledge of use of DME;ROM;Mobility    Rehab Potential  Good    OT Frequency  1x / week   for 3 weeks (then 2x/wk for 4 weeks)   OT Treatment/Interventions  Self-care/ADL training;Therapeutic exercise;Functional Mobility Training;Neuromuscular education;Manual Therapy;Splinting;Therapeutic activities;DME and/or AE instruction;Moist Heat;Passive range of motion;Patient/family education    Plan  resting hand splint adjustments prn,  initiate HEP and discuss safety d/t lack of sensation RT side)    Consulted and Agree with Plan of Care  Patient;Family member/caregiver    Family Member Consulted  son       Patient will benefit from skilled therapeutic  intervention in order to improve the following deficits and impairments:   Body Structure / Function / Physical Skills: ADL, IADL, Sensation, Improper spinal/pelvic alignment, Strength, Muscle spasms, UE functional use, Pain, Proprioception, Decreased knowledge of use of DME, ROM, Mobility       Visit Diagnosis: 1. Hemiplegia and hemiparesis following cerebral infarction affecting right dominant side [facility])       Problem List Patient Active Problem List   Diagnosis Date Noted  . History of ischemic stroke without residual deficits 06/02/2019  . Essential hypertension 06/02/2019  . Hyperlipidemia 06/02/2019  . Coronary artery disease involving native coronary artery of native heart without angina pectoris 06/02/2019  . Tobacco dependence 06/02/2019  . Overgrown toenails 06/02/2019  . Bowel and bladder incontinence 06/02/2019  . Decreased breath sounds at right lung base 06/02/2019  . Homeless 06/02/2019  . Vitamin D deficiency 06/02/2019    Francisco Obrien, OTR/L 07/03/2019, 10:13 AM  [facility] [address] [place], [place], [address] Phone: [phone]   Fax:  [phone]  Name: Francisco Obrien MRN: [id number] Date of Birth: 1955/11/20

## 2019-07-05 ENCOUNTER — Other Ambulatory Visit: Payer: Self-pay

## 2019-07-05 ENCOUNTER — Ambulatory Visit: Payer: Medicaid Other | Admitting: Physical Therapy

## 2019-07-05 DIAGNOSIS — I69351 Hemiplegia and hemiparesis following cerebral infarction affecting right dominant side: Secondary | ICD-10-CM | POA: Diagnosis not present

## 2019-07-05 DIAGNOSIS — R2681 Unsteadiness on feet: Secondary | ICD-10-CM

## 2019-07-05 DIAGNOSIS — R262 Difficulty in walking, not elsewhere classified: Secondary | ICD-10-CM

## 2019-07-05 NOTE — Therapy (Signed)
[facility] [address] [place], [place], [address] Phone: [phone]   Fax:  [phone]  Physical Therapy Treatment  Patient Details  Name: Francisco Obrien MRN: [id number] Date of Birth: 1956/04/11 No data recorded  Encounter Date: 07/05/2019  PT End of Session - 07/05/19 1210    Visit Number  3    Number of Visits  12    Date for PT Re-Evaluation  09/13/19    Authorization Type  MCD asking for 12 visits    Authorization - Visit Number  2    Authorization - Number of Visits  12    PT Start Time  0845    PT Stop Time  0930    PT Time Calculation (min)  45 min    Activity Tolerance  Patient tolerated treatment well    Behavior During Therapy  [facility] for tasks assessed/performed       Past Medical History:  Diagnosis Date  . CHF (congestive heart failure) ([place])   . Coronary artery disease   . Stroke [facility])     No past surgical history on file.  There were no vitals filed for this visit.  Subjective Assessment - 07/05/19 1209    Subjective  no new complaints, has been working on HEP at home                       [facility] Adult PT Treatment/Exercise - 07/05/19 0001      Exercises   Other Exercises   standing LE weight shifts with manual facilitation onto Rt leg   X20 latreal, and A-P. Then UE weight shifting down onto Rt elbow X 20 all at counter with min A.        transfersfers wc<>bed X 5 ea with supervision, bed mobillity with cues to use Lt leg to assist Rt leg so he can perform indpendently,  PROM and manual stretching Rt leg for hamstring stretch, gastroc stretch, and hip IR ROM. step ups in bars 2 inch  X5 progressed to 4 inch  X 3 gait 40 ft X 1 with min guard and hemiwalker        PT Short Term Goals - 06/21/19 1324      PT SHORT TERM GOAL #1   Title  Pt will be I and compliant with HEP. (6 weeks 08/02/19)    Status  New      PT SHORT TERM GOAL #2   Title  Pt will decrease  5TSTS time to less than 30 sec    Baseline  35 sec    Status  New        PT Long Term Goals - 06/21/19 1326      PT LONG TERM GOAL #1   Title  Pt will decrease 5TSTS test to less than 25 sec. (Target for all LTG's 12 weeks 09/13/19)    Baseline  35 sec    Status  New      PT LONG TERM GOAL #2   Title  Pt will be able to ambulate at least 110 ft with hemiwalker.    Baseline  30 ft with hemiwalker    Status  New      PT LONG TERM GOAL #3   Title  He will improrve gait speed for 10 ft to less than 30 seconds    Baseline  39 sec    Status  New  Plan - 07/05/19 1210    Clinical Impression Statement  Session focused on transfers, gait, and Rt leg strength. He was able to add in step ups on his Rt leg in // bars for 2 inch and 4 inch but needs assistance with 4 inch. He will have orthothic consult next visit for AFO consideration.    Personal Factors and Comorbidities  Comorbidity 1;Comorbidity 2;Comorbidity 3+    Comorbidities  Patient with history of CHF, HTN, CAD (2 stents in 2013 and 2003), CVA  (09/2016) with right hemiparesis, incont of urine and bowel,  tob depen, vit D def    Examination-Activity Limitations  Bathing;Locomotion Level;Transfers;Stairs;Stand;Bed Mobility    Examination-Participation Restrictions  Cleaning;Community Activity;Shop;Laundry    Stability/Clinical Decision Making  Evolving/Moderate complexity    Rehab Potential  Good    PT Frequency  Other (comment)   1-2   PT Duration  12 weeks    PT Treatment/Interventions  ADLs/Self Care Home Management;DME Instruction;Gait training;Stair training;Functional mobility training;Therapeutic activities;Therapeutic exercise;Balance training;Neuromuscular re-education;Manual techniques;Passive range of motion    PT Next Visit Plan  review HEP, needs gait and functional mobility and balance    PT Home Exercise Plan  weight shifting, balance with head turns, sit to stands    Consulted and Agree with Plan of  Care  Patient;Family member/caregiver    Family Member Consulted  son       Patient will benefit from skilled therapeutic intervention in order to improve the following deficits and impairments:  Abnormal gait, Decreased activity tolerance, Decreased balance, Decreased mobility, Decreased endurance, Decreased range of motion, Decreased strength, Difficulty walking, Obesity  Visit Diagnosis: 1. Unsteadiness on feet   2. Difficulty in walking, not elsewhere classified   3. Hemiplegia and hemiparesis following cerebral infarction affecting right dominant side [facility](HCC)        Problem List Patient Active Problem List   Diagnosis Date Noted  . History of ischemic stroke without residual deficits 06/02/2019  . Essential hypertension 06/02/2019  . Hyperlipidemia 06/02/2019  . Coronary artery disease involving native coronary artery of native heart without angina pectoris 06/02/2019  . Tobacco dependence 06/02/2019  . Overgrown toenails 06/02/2019  . Bowel and bladder incontinence 06/02/2019  . Decreased breath sounds at right lung base 06/02/2019  . Homeless 06/02/2019  . Vitamin D deficiency 06/02/2019    April MansonBrian R Darriel Sinquefield, PT,DPT 07/05/2019, 12:17 PM  [facility] [address] [place], [place], [address] Phone: [phone]   Fax:  [phone]  Name: Francisco HedgerDaniel Obrien MRN: [id number] Date of Birth: 1956-09-10

## 2019-07-10 ENCOUNTER — Other Ambulatory Visit: Payer: Self-pay

## 2019-07-10 ENCOUNTER — Ambulatory Visit: Payer: Medicaid Other | Admitting: Physical Therapy

## 2019-07-10 DIAGNOSIS — I69351 Hemiplegia and hemiparesis following cerebral infarction affecting right dominant side: Secondary | ICD-10-CM | POA: Diagnosis not present

## 2019-07-10 DIAGNOSIS — R2681 Unsteadiness on feet: Secondary | ICD-10-CM

## 2019-07-10 DIAGNOSIS — R262 Difficulty in walking, not elsewhere classified: Secondary | ICD-10-CM

## 2019-07-10 NOTE — Therapy (Signed)
[facility] [address] [place], [place], [address] Phone: [phone]   Fax:  [phone]  Physical Therapy Treatment  Patient Details  Name: Francisco Obrien MRN: [id number] Date of Birth: January 23, 1956 No data recorded  Encounter Date: 07/10/2019  PT End of Session - 07/10/19 1121    Visit Number  4    Number of Visits  12    Date for PT Re-Evaluation  09/13/19    Authorization Type  MCD asking for 12 visits    Authorization - Visit Number  3    Authorization - Number of Visits  12    PT Start Time  0930    PT Stop Time  1015    PT Time Calculation (min)  45 min    Activity Tolerance  Patient tolerated treatment well    Behavior During Therapy  [facility] for tasks assessed/performed       Past Medical History:  Diagnosis Date  . CHF (congestive heart failure) ([place])   . Coronary artery disease   . Stroke [facility])     No past surgical history on file.  There were no vitals filed for this visit.  Subjective Assessment - 07/10/19 1117    Subjective  relays some tightness with his Rt hamstings, he feels he needs a leg brace to help him from catching his Rt foot    Pertinent History  2016 CVA with Rt hemiparesis    Currently in Pain?  No/denies                       [facility] Adult PT Treatment/Exercise - 07/10/19 0001      Transfers   Transfers  Sit to Stand;Stand to Sit;Stand Pivot Transfers    Sit to Stand  5: Supervision    Sit to Stand Details (indicate cue type and reason)  X10 during session    Stand to Sit  5: Supervision    Stand Pivot Transfers  5: Supervision    Stand Pivot Transfer Details (indicate cue type and reason)  --   needs hemiwalker     Ambulation/Gait   Ambulation/Gait  Yes    Ambulation/Gait Assistance  5: Supervision    Ambulation Distance (Feet)  50 Feet   X2   Assistive device  Hemi-walker    Gait Comments  trialed AFO      Exercises   Other Exercises   sitting hamstring  stretch 30 sec X 3, then gastroc stretch pulling with gait belt 30 sec X 3             PT Education - 07/10/19 1121    Education Details  AFO recommendations and indications    Person(s) Educated  Patient;Child(ren)    Methods  Explanation    Comprehension  Verbalized understanding       PT Short Term Goals - 06/21/19 1324      PT SHORT TERM GOAL #1   Title  Pt will be I and compliant with HEP. (6 weeks 08/02/19)    Status  New      PT SHORT TERM GOAL #2   Title  Pt will decrease 5TSTS time to less than 30 sec    Baseline  35 sec    Status  New        PT Long Term Goals - 06/21/19 1326      PT LONG TERM GOAL #1   Title  Pt will decrease 5TSTS test to less  than 25 sec. (Target for all LTG's 12 weeks 09/13/19)    Baseline  35 sec    Status  New      PT LONG TERM GOAL #2   Title  Pt will be able to ambulate at least 110 ft with hemiwalker.    Baseline  30 ft with hemiwalker    Status  New      PT LONG TERM GOAL #3   Title  He will improrve gait speed for 10 ft to less than 30 seconds    Baseline  39 sec    Status  New            Plan - 07/10/19 1122    Clinical Impression Statement  Session focused on AFO considerations and collaboration with Megan from [facility]. He was trialed with AFO carbon fiber but this does not appear to give him the ankle stability that he needs however does improve foot clearance and decreased his risk for falls. Orthotist further recommending custom thermoplastic brace and PT agrees that this will help him with ambulation and decrease his risk for falls.    Personal Factors and Comorbidities  Comorbidity 1;Comorbidity 2;Comorbidity 3+    Comorbidities  Patient with history of CHF, HTN, CAD (2 stents in 2013 and 2003), CVA  (09/2016) with right hemiparesis, incont of urine and bowel,  tob depen, vit D def    Examination-Activity Limitations  Bathing;Locomotion Level;Transfers;Stairs;Stand;Bed Mobility    Examination-Participation  Restrictions  Cleaning;Community Activity;Shop;Laundry    Stability/Clinical Decision Making  Evolving/Moderate complexity    Rehab Potential  Good    PT Frequency  Other (comment)   1-2   PT Duration  12 weeks    PT Treatment/Interventions  ADLs/Self Care Home Management;DME Instruction;Gait training;Stair training;Functional mobility training;Therapeutic activities;Therapeutic exercise;Balance training;Neuromuscular re-education;Manual techniques;Passive range of motion    PT Next Visit Plan  needs gait and functional mobility and balance    PT Home Exercise Plan  weight shifting, balance with head turns, sit to stands    Consulted and Agree with Plan of Care  Patient;Family member/caregiver    Family Member Consulted  son       Patient will benefit from skilled therapeutic intervention in order to improve the following deficits and impairments:  Abnormal gait, Decreased activity tolerance, Decreased balance, Decreased mobility, Decreased endurance, Decreased range of motion, Decreased strength, Difficulty walking, Obesity  Visit Diagnosis: Unsteadiness on feet  Difficulty in walking, not elsewhere classified  Hemiplegia and hemiparesis following cerebral infarction affecting right dominant side [facility](HCC)     Problem List Patient Active Problem List   Diagnosis Date Noted  . History of ischemic stroke without residual deficits 06/02/2019  . Essential hypertension 06/02/2019  . Hyperlipidemia 06/02/2019  . Coronary artery disease involving native coronary artery of native heart without angina pectoris 06/02/2019  . Tobacco dependence 06/02/2019  . Overgrown toenails 06/02/2019  . Bowel and bladder incontinence 06/02/2019  . Decreased breath sounds at right lung base 06/02/2019  . Homeless 06/02/2019  . Vitamin D deficiency 06/02/2019    Birdie RiddleBrian R Nelson,PT,DPT 07/10/2019, 11:26 AM  [facility] [address] [place], [place], [address] Phone: [phone]   Fax:  [phone]  Name: Francisco Obrien MRN: [id number] Date of Birth: [birth year]

## 2019-07-12 ENCOUNTER — Ambulatory Visit: Payer: Medicaid Other | Admitting: Physical Therapy

## 2019-07-12 ENCOUNTER — Other Ambulatory Visit: Payer: Self-pay

## 2019-07-12 ENCOUNTER — Encounter: Payer: Self-pay | Admitting: Physical Therapy

## 2019-07-12 ENCOUNTER — Ambulatory Visit: Payer: Medicaid Other | Admitting: Occupational Therapy

## 2019-07-12 VITALS — BP 133/84

## 2019-07-12 DIAGNOSIS — R2681 Unsteadiness on feet: Secondary | ICD-10-CM

## 2019-07-12 DIAGNOSIS — I69351 Hemiplegia and hemiparesis following cerebral infarction affecting right dominant side: Secondary | ICD-10-CM

## 2019-07-12 DIAGNOSIS — R262 Difficulty in walking, not elsewhere classified: Secondary | ICD-10-CM

## 2019-07-12 NOTE — Therapy (Signed)
[place] [address] [place], [place], [address] Phone: [phone]   Fax:  [phone]  Physical Therapy Treatment  Patient Details  Name: Francisco Obrien MRN: [id number] Date of Birth: [birth year] No data recorded  Encounter Date: 07/12/2019    Past Medical History:  Diagnosis Date  . CHF (congestive heart failure) ([place])   . Coronary artery disease   . Stroke [facility])     History reviewed. No pertinent surgical history.  Vitals:   07/12/19 0951  BP: 133/84    Subjective Assessment - 07/12/19 0943    Subjective  PCP has been working with pt on BP control due to it tending to be high. Pt has another appointment Sept 4th    Pertinent History  2016 CVA with Rt hemiparesis    Limitations  Standing                       [facility] Adult PT Treatment/Exercise - 07/12/19 0001      Transfers   Transfers  Sit to Stand;Stand to Sit;Stand Pivot Transfers    Sit to Stand  5: Supervision    Stand to Sit  5: Supervision    Stand Pivot Transfers  5: Supervision      Ambulation/Gait   Ambulation/Gait  Yes    Ambulation/Gait Assistance  5: Supervision    Ambulation/Gait Assistance Details  cues for R foot placement    Ambulation Distance (Feet)  30 Feet   x2   Assistive device  Hemi-walker    Gait Pattern  Decreased arm swing - right;Decreased step length - right;Decreased step length - left;Decreased hip/knee flexion - right;Right circumduction    Ambulation Surface  Level;Indoor      Exercises   Exercises  Knee/Hip      Knee/Hip Exercises: Stretches   Passive Hamstring Stretch  Right;2 reps;30 seconds   with strap around foot for gastroc stretch.   Other Knee/Hip Stretches  hip external rotators L/R stretch, PROM, 30 seconds, sitting and supine position       Knee/Hip Exercises: Aerobic   Nustep  Level 1-3, 41min      Knee/Hip Exercises: Seated   Long Arc Quad  Strengthening;Right;Left;2 sets;10  reps;Weights;Other (comment)   #2 weight with LLE, AAROM RLE         Balance Exercises - 07/12/19 1314      Balance Exercises: Standing   Other Standing Exercises  sit to squat position without UE support, working on increasing weight bearing and shifting to RLE with min to mod A.                                                        PT Short Term Goals - 06/21/19 1324      PT SHORT TERM GOAL #1   Title  Pt will be I and compliant with HEP. (6 weeks 08/02/19)    Status  New      PT SHORT TERM GOAL #2   Title  Pt will decrease 5TSTS time to less than 30 sec    Baseline  35 sec    Status  New        PT Long Term Goals - 06/21/19 1326      PT LONG TERM GOAL #1   Title  Pt will decrease 5TSTS test to less than 25 sec. (Target for all LTG's 12 weeks 09/13/19)    Baseline  35 sec    Status  New      PT LONG TERM GOAL #2   Title  Pt will be able to ambulate at least 110 ft with hemiwalker.    Baseline  30 ft with hemiwalker    Status  New      PT LONG TERM GOAL #3   Title  He will improrve gait speed for 10 ft to less than 30 seconds    Baseline  39 sec    Status  New            Plan - 07/12/19 1302    Clinical Impression Statement  Skilled session focused on LE strengthening (R+L),RLE stretching, and NMR weight bearing activity for RLE.  Pt performed strengthening/ ROM well on Nustep progressing with increased resistence during session.  Pt performed LLE strengthening with ankle weights and RLE AAROM for quad strengthening; discussed how pt would benefit from using ankle weights at home for strengthening RLE.  Training for weight shifitng and increased weight bearing from sit to squat position without UE support with min to mod A .    Personal Factors and Comorbidities  Comorbidity 1;Comorbidity 2;Comorbidity 3+    Comorbidities  Patient with history of CHF, HTN, CAD (2 stents in 2013 and 2003), CVA  (09/2016) with right hemiparesis, incont of urine and bowel,   tob depen, vit D def    Examination-Activity Limitations  Bathing;Locomotion Level;Transfers;Stairs;Stand;Bed Mobility    Examination-Participation Restrictions  Cleaning;Community Activity;Shop;Laundry    Stability/Clinical Decision Making  Evolving/Moderate complexity    Rehab Potential  Good    PT Frequency  Other (comment)   1-2   PT Duration  12 weeks    PT Treatment/Interventions  ADLs/Self Care Home Management;DME Instruction;Gait training;Stair training;Functional mobility training;Therapeutic activities;Therapeutic exercise;Balance training;Neuromuscular re-education;Manual techniques;Passive range of motion    PT Next Visit Plan  needs gait and functional mobility and balance    PT Home Exercise Plan  weight shifting, balance with head turns, sit to stands    Consulted and Agree with Plan of Care  Patient;Family member/caregiver    Family Member Consulted  son       Patient will benefit from skilled therapeutic intervention in order to improve the following deficits and impairments:  Abnormal gait, Decreased activity tolerance, Decreased balance, Decreased mobility, Decreased endurance, Decreased range of motion, Decreased strength, Difficulty walking, Obesity  Visit Diagnosis: Unsteadiness on feet  Difficulty in walking, not elsewhere classified  Hemiplegia and hemiparesis following cerebral infarction affecting right dominant side [facility](HCC)     Problem List Patient Active Problem List   Diagnosis Date Noted  . History of ischemic stroke without residual deficits 06/02/2019  . Essential hypertension 06/02/2019  . Hyperlipidemia 06/02/2019  . Coronary artery disease involving native coronary artery of native heart without angina pectoris 06/02/2019  . Tobacco dependence 06/02/2019  . Overgrown toenails 06/02/2019  . Bowel and bladder incontinence 06/02/2019  . Decreased breath sounds at right lung base 06/02/2019  . Homeless 06/02/2019  . Vitamin D deficiency 06/02/2019    Hortencia ConradiKarissa Tinlee Navarrette, PTA  07/12/19, 1:18 PM [facility] [address] [place], [place], [address] Phone: [phone]   Fax:  [phone]  Name: Levon HedgerDaniel Caraher MRN: [id number] Date of Birth: [birth year]

## 2019-07-17 ENCOUNTER — Ambulatory Visit: Payer: Medicaid Other | Admitting: Physical Therapy

## 2019-07-18 ENCOUNTER — Emergency Department (HOSPITAL_COMMUNITY)
Admission: EM | Admit: 2019-07-18 | Discharge: 2019-07-18 | Disposition: A | Discharge status: Home / Self Care | Payer: Medicaid Other | Attending: Emergency Medicine | Admitting: Emergency Medicine

## 2019-07-18 ENCOUNTER — Emergency Department (HOSPITAL_COMMUNITY)
Admit: 2019-07-18 | Discharge: 2019-07-18 | Disposition: A | Discharge status: Home / Self Care | Payer: Medicaid Other | Attending: Internal Medicine | Admitting: Internal Medicine

## 2019-07-18 DIAGNOSIS — I509 Heart failure, unspecified: Secondary | ICD-10-CM | POA: Insufficient documentation

## 2019-07-18 DIAGNOSIS — R0689 Other abnormalities of breathing: Secondary | ICD-10-CM

## 2019-07-18 DIAGNOSIS — Z5321 Procedure and treatment not carried out due to patient leaving prior to being seen by health care provider: Secondary | ICD-10-CM | POA: Diagnosis not present

## 2019-07-19 ENCOUNTER — Other Ambulatory Visit: Payer: Self-pay | Admitting: Internal Medicine

## 2019-07-19 ENCOUNTER — Ambulatory Visit: Payer: Medicaid Other | Attending: Internal Medicine | Admitting: Physical Therapy

## 2019-07-19 ENCOUNTER — Ambulatory Visit: Payer: Medicaid Other | Admitting: Occupational Therapy

## 2019-07-19 ENCOUNTER — Telehealth: Payer: Self-pay

## 2019-07-19 DIAGNOSIS — G8929 Other chronic pain: Secondary | ICD-10-CM | POA: Insufficient documentation

## 2019-07-19 DIAGNOSIS — R2681 Unsteadiness on feet: Secondary | ICD-10-CM | POA: Insufficient documentation

## 2019-07-19 DIAGNOSIS — I2510 Atherosclerotic heart disease of native coronary artery without angina pectoris: Secondary | ICD-10-CM

## 2019-07-19 DIAGNOSIS — I517 Cardiomegaly: Secondary | ICD-10-CM

## 2019-07-19 DIAGNOSIS — R262 Difficulty in walking, not elsewhere classified: Secondary | ICD-10-CM | POA: Insufficient documentation

## 2019-07-19 DIAGNOSIS — I69351 Hemiplegia and hemiparesis following cerebral infarction affecting right dominant side: Secondary | ICD-10-CM | POA: Insufficient documentation

## 2019-07-19 DIAGNOSIS — M25511 Pain in right shoulder: Secondary | ICD-10-CM | POA: Insufficient documentation

## 2019-07-19 NOTE — Telephone Encounter (Addendum)
Contacted pt son to go over xray results and to give appointment information for echo. Pt son didn't answer lvm asking pt son to give me a call at his earliest convenience    Contacted the Cardiovascular dept to get appointment Echo is scheduled for July 21, 2019 at 8am at [facility]. Pt will need to arrive at 745am. Pt will need to go thorough Entrance C   Went on the [employer] and tried to do prior [place]. [employer] wouldn't recognize the cpt code for the echo. Will call the vascular dept and make them aware

## 2019-07-21 ENCOUNTER — Other Ambulatory Visit: Payer: Self-pay

## 2019-07-21 ENCOUNTER — Encounter: Payer: Self-pay | Admitting: Internal Medicine

## 2019-07-21 ENCOUNTER — Ambulatory Visit (HOSPITAL_BASED_OUTPATIENT_CLINIC_OR_DEPARTMENT_OTHER): Payer: Medicaid Other | Admitting: Internal Medicine

## 2019-07-21 ENCOUNTER — Ambulatory Visit (HOSPITAL_COMMUNITY)
Admission: RE | Admit: 2019-07-21 | Discharge: 2019-07-21 | Disposition: A | Discharge status: Home / Self Care | Payer: Medicaid Other | Source: Ambulatory Visit | Attending: Internal Medicine | Admitting: Internal Medicine

## 2019-07-21 DIAGNOSIS — Z8673 Personal history of transient ischemic attack (TIA), and cerebral infarction without residual deficits: Secondary | ICD-10-CM | POA: Diagnosis not present

## 2019-07-21 DIAGNOSIS — I69351 Hemiplegia and hemiparesis following cerebral infarction affecting right dominant side: Secondary | ICD-10-CM

## 2019-07-21 DIAGNOSIS — I517 Cardiomegaly: Secondary | ICD-10-CM

## 2019-07-21 DIAGNOSIS — I509 Heart failure, unspecified: Secondary | ICD-10-CM | POA: Insufficient documentation

## 2019-07-21 DIAGNOSIS — Z23 Encounter for immunization: Secondary | ICD-10-CM | POA: Diagnosis not present

## 2019-07-21 DIAGNOSIS — I6930 Unspecified sequelae of cerebral infarction: Secondary | ICD-10-CM

## 2019-07-21 DIAGNOSIS — I255 Ischemic cardiomyopathy: Secondary | ICD-10-CM | POA: Insufficient documentation

## 2019-07-21 DIAGNOSIS — I2510 Atherosclerotic heart disease of native coronary artery without angina pectoris: Secondary | ICD-10-CM | POA: Diagnosis present

## 2019-07-21 DIAGNOSIS — I083 Combined rheumatic disorders of mitral, aortic and tricuspid valves: Secondary | ICD-10-CM | POA: Diagnosis not present

## 2019-07-21 MED FILL — ALBUTEROL SULFATE HFA 108 (: 108 (90 BAS | 25 days supply | Qty: 9 | Fill #1

## 2019-07-21 NOTE — Progress Notes (Signed)
  Echocardiogram 2D Echocardiogram has been performed.  Francisco Obrien G Cara Aguino 07/21/2019, 9:09 AM

## 2019-07-21 NOTE — Progress Notes (Addendum)
Virtual Visit via Telephone Note Due to current restrictions/limitations of in-office visits due to the COVID-19 pandemic, this scheduled clinical appointment was converted to a telehealth visit  I connected with Francisco Obrien on 07/21/19 at 1:12 p.m by telephone and verified that I am speaking with the correct person using two identifiers. I am in my office.  The patient is at home.  Only the patient, son [name] and myself participated in this encounter.  I discussed the limitations, risks, security and privacy concerns of performing an evaluation and management service by telephone and the availability of in person appointments. I also discussed with the patient that there may be a patient responsible charge related to this service. The patient expressed understanding and agreed to proceed.   History of Present Illness: Patient with history of CHF, HTN, CAD (2 stents in 2013 and 2003), CVA  (09/2016) with right hemiparesis, incont of urine and bowel,  tob depen, vit D def.  Last seen in July   CVA: going to P.T x 2/wk.  Pt and son feels he is improving slightly -therapist recommends that he gets a brace for the RT leg to help prevent him from dragging his foot when he walks.  Needs rxn and letter of recommendation from me of how it would help him Has appt with [facility] next Wednesday at 8:15 a.m to be measured for a brace   Hx CHF/CAD: had CXR done due to decrease breath sounds.  This revealed mild to moderate cardiomegaly that has progressed compared to a previous x-ray.  The lungs were clear.  I ordered an echo which he had done today.  This reveals severe hypokinesis of the left ventricle with reduced EF of 25-30%. -pt denies any CP/SOB at this time.    Outpatient Encounter Medications as of 07/21/2019  Medication Sig  . albuterol (VENTOLIN HFA) 108 (90 Base) MCG/ACT inhaler Inhale 2 puffs into the lungs every 6 (six) hours as needed for wheezing or shortness of breath.  [name]. amLODipine  (NORVASC) 10 MG tablet Take 1 tablet (10 mg total) by mouth daily.  [name]. aspirin 81 MG tablet Take 1 tablet (81 mg total) by mouth daily. (Patient not taking: Reported on 06/21/2019)  . aspirin EC 81 MG tablet Take 1 tablet (81 mg total) by mouth daily.  [name]. atorvastatin (LIPITOR) 40 MG tablet Take 1 tablet (40 mg total) by mouth at bedtime.  . baclofen (LIORESAL) 10 MG tablet Take 1 tablet (10 mg total) by mouth 3 (three) times daily.  . carvedilol (COREG) 12.5 MG tablet Take 1 tablet (12.5 mg total) by mouth 2 (two) times daily with a meal.  . clopidogrel (PLAVIX) 75 MG tablet Take 1 tablet (75 mg total) by mouth daily.  . ergocalciferol (VITAMIN D2) 1.25 MG (50000 UT) capsule Take 1 capsule (50,000 Units total) by mouth once a week. (Patient not taking: Reported on 06/21/2019)  . folic acid (FOLVITE) 1 MG tablet Take 1 mg by mouth daily.  [name]. lisinopril (ZESTRIL) 10 MG tablet Take 1 tablet (10 mg total) by mouth daily.  . Misc. Devices MISC Adult sized diaper-large.  Dispense 1 box with refills. Dx: bowel and bladder incontinence  . nicotine polacrilex (NICORETTE) 2 MG gum Take 1 each (2 mg total) by mouth as needed for smoking cessation.   No facility-administered encounter medications on file as of 07/21/2019.     Observations/Objective: Results for orders placed or performed in visit on 06/02/19  CBC  Result Value Ref Range  WBC 8.5 3.4 - 10.8 x10E3/uL   RBC 4.73 4.14 - 5.80 x10E6/uL   Hemoglobin 14.2 13.0 - 17.7 g/dL   Hematocrit 43.4 37.5 - 51.0 %   MCV 92 79 - 97 fL   MCH 30.0 26.6 - 33.0 pg   MCHC 32.7 31.5 - 35.7 g/dL   RDW 12.1 11.6 - 15.4 %   Platelets 299 150 - 450 x10E3/uL  Comprehensive metabolic panel  Result Value Ref Range   Glucose 117 (H) 65 - 99 mg/dL   BUN 13 8 - 27 mg/dL   Creatinine, Ser 1.22 0.76 - 1.27 mg/dL   GFR calc non Af Amer 64 >59 mL/min/1.73   GFR calc Af Amer 74 >59 mL/min/1.73   BUN/Creatinine Ratio 11 10 - 24   Sodium 141 134 - 144 mmol/L   Potassium  4.3 3.5 - 5.2 mmol/L   Chloride 99 96 - 106 mmol/L   CO2 23 20 - 29 mmol/L   Calcium 9.9 8.6 - 10.2 mg/dL   Total Protein 8.3 6.0 - 8.5 g/dL   Albumin 4.2 3.8 - 4.8 g/dL   Globulin, Total 4.1 1.5 - 4.5 g/dL   Albumin/Globulin Ratio 1.0 (L) 1.2 - 2.2   Bilirubin Total 0.7 0.0 - 1.2 mg/dL   Alkaline Phosphatase 123 (H) 39 - 117 IU/L   AST 24 0 - 40 IU/L   ALT 16 0 - 44 IU/L  Lipid panel  Result Value Ref Range   Cholesterol, Total 231 (H) 100 - 199 mg/dL   Triglycerides 81 0 - 149 mg/dL   HDL 63 >39 mg/dL   VLDL Cholesterol Cal 16 5 - 40 mg/dL   LDL Calculated 152 (H) 0 - 99 mg/dL   Chol/HDL Ratio 3.7 0.0 - 5.0 ratio  VITAMIN D 25 Hydroxy (Vit-D Deficiency, Fractures)  Result Value Ref Range   Vit D, 25-Hydroxy 33.6 30.0 - 100.0 ng/mL     Assessment and Plan: 1. Ischemic cardiomyopathy -pt to continue Lisinopril and Coreg.  Referral to cardiology  2. Hemiparesis affecting right side as late effect of stroke ([place]) 3. History of ischemic stroke with residual deficits -pt reports some benefit from P.T so far. -Rxn written for him to get AFO brace for RT LE   4. Influenza vaccine needed -Advise son to take him to any nearby pharmacy to get flu shot or brin him as nurse only visit here to get it.  He plans to take him to an outside pharmacy.   Addendum: Patient has dense hemiparesis on the right upper and lower extremity.  He ambulates with a hemiwalker.  He has foot drop on the right as a result of hemiparesis.  The patient and his son are worried about him falling.  The right AFO would help prevent unwanted movement at the foot/ankle, provide foot clearance in swing and increase knee stability in stance.  Due to the lack of control and instability of the right lower extremity he is a candidate for a custom solid AFO.  Patient's son had also called back requesting for a sling for his right upper extremity.  The right arm feels heavy like it is falling out of the socket.  He spoke  with the orthotic store and they have recommended a Giv[employer]r sling which will promote functional positioning of the right upper extremity and reduce subluxation in sitting and standing and reduce shoulder pain. Follow Up Instructions: 2 mths in person  I discussed the assessment and treatment plan with the patient.  The patient was provided an opportunity to ask questions and all were answered. The patient agreed with the plan and demonstrated an understanding of the instructions.   The patient was advised to call back or seek an in-person evaluation if the symptoms worsen or if the condition fails to improve as anticipated.  I provided 12 minutes of non-face-to-face time during this encounter.   Karle Plumber, MD

## 2019-07-21 NOTE — Progress Notes (Signed)
Letter of recommendations for a leg brace from [facility]

## 2019-07-26 ENCOUNTER — Encounter: Payer: Self-pay | Admitting: Physical Therapy

## 2019-07-26 ENCOUNTER — Other Ambulatory Visit: Payer: Self-pay

## 2019-07-26 ENCOUNTER — Ambulatory Visit: Payer: Medicaid Other | Admitting: Physical Therapy

## 2019-07-26 DIAGNOSIS — R262 Difficulty in walking, not elsewhere classified: Secondary | ICD-10-CM

## 2019-07-26 DIAGNOSIS — I69351 Hemiplegia and hemiparesis following cerebral infarction affecting right dominant side: Secondary | ICD-10-CM | POA: Diagnosis present

## 2019-07-26 DIAGNOSIS — R2681 Unsteadiness on feet: Secondary | ICD-10-CM | POA: Diagnosis not present

## 2019-07-26 DIAGNOSIS — M25511 Pain in right shoulder: Secondary | ICD-10-CM | POA: Diagnosis present

## 2019-07-26 DIAGNOSIS — G8929 Other chronic pain: Secondary | ICD-10-CM | POA: Diagnosis present

## 2019-07-26 NOTE — Therapy (Signed)
[place] [address] [place], [place], [address] Phone: [phone]   Fax:  [phone]  Physical Therapy Treatment  Patient Details  Name: Francisco Obrien MRN: [id number] Date of Birth: 11/23/1955 No data recorded  Encounter Date: 07/26/2019  PT End of Session - 07/26/19 1207    Visit Number  6    Number of Visits  12    Date for PT Re-Evaluation  09/13/19    Authorization Type  [facility] asking for 12 visits    Authorization - Visit Number  5    Authorization - Number of Visits  12    PT Start Time  1020    PT Stop Time  1103    PT Time Calculation (min)  43 min    Equipment Utilized During Treatment  Gait belt    Activity Tolerance  Patient tolerated treatment well    Behavior During Therapy  [facility] for tasks assessed/performed       Past Medical History:  Diagnosis Date  . CHF (congestive heart failure) ([facility])   . Coronary artery disease   . Stroke [facility])     History reviewed. No pertinent surgical history.  There were no vitals filed for this visit.  Subjective Assessment - 07/26/19 1027    Subjective  States that he went to [facility] and saw Meghan this morning and was fitted for a custom brace - which should be arriving at the beginning of October.    Pertinent History  2016 CVA with Rt hemiparesis    Limitations  Standing                       [facility] Adult PT Treatment/Exercise - 07/26/19 0001      Transfers   Transfers  Sit to Stand;Stand to Sit;Stand Pivot Transfers    Sit to Stand  4: Min guard    Sit to Stand Details  Manual facilitation for weight shifting;Verbal cues for technique;Verbal cues for sequencing    Stand to Sit  4: Min guard;4: Min assist    Stand to Sit Details  min A for sitting without UE support and keeping weight shifted towards R for increased weight bearing.     Stand Pivot Transfers  5: Supervision    Comments  Throughout session performed sit <> stands x10 reps        Neuro Re-ed    Neuro Re-ed Details   At edge of mat table practiced M/L weight shifting massed practice - with therapist providing min A for weight shifting to R. Cues for upright posture - with purple flat disc in front of LLE practiced shifting weight to R and stepping forward with LLE - 10 reps. Cues for technique and weight shifting, with increased reps pt able to demonstrate improved weight shifting to RLE with better clearance of LLE to step.       Exercises   Exercises  Knee/Hip      Knee/Hip Exercises: Supine   Bridges  4 sets;5 reps    Bridges Limitations  Therapist needing to help pt position RLE to get into position, therapist providing manual facilitation through RLE for weight bearing and tactile cues and R ASIS to lift R pelvis first for decreased LLE substitution. Pt able to hold for 2 seconds before lowering. Instructed pt's son on how pt can perform at home with assistance - added to HEP             PT  Education - 07/26/19 1206    Education Details  addition of bridging to HEP for LE strengthening    Person(s) Educated  Patient;Child(ren)    Methods  Explanation;Demonstration;Handout    Comprehension  Verbalized understanding;Returned demonstration       PT Short Term Goals - 06/21/19 1324      PT SHORT TERM GOAL #1   Title  Pt will be I and compliant with HEP. (6 weeks 08/02/19)    Status  New      PT SHORT TERM GOAL #2   Title  Pt will decrease 5TSTS time to less than 30 sec    Baseline  35 sec    Status  New        PT Long Term Goals - 06/21/19 1326      PT LONG TERM GOAL #1   Title  Pt will decrease 5TSTS test to less than 25 sec. (Target for all LTG's 12 weeks 09/13/19)    Baseline  35 sec    Status  New      PT LONG TERM GOAL #2   Title  Pt will be able to ambulate at least 110 ft with hemiwalker.    Baseline  30 ft with hemiwalker    Status  New      PT LONG TERM GOAL #3   Title  He will improrve gait speed for 10 ft to less than 30  seconds    Baseline  39 sec    Status  New            Plan - 07/26/19 1208    Clinical Impression Statement  Skilled session focused on sit <> stands, standing balance, M/L weight shifting, and LE strengthening. Pt needing min A in standing for full weight shift towards R and min A to keep weight shifting to R when performing stand > sit and taking steps forward with LLE. With increased reps, pt able to demonstrate improved ability to weight shift with no UE support. Able to perform static standing with no UE support and min guard x1 minute. Added bridging today to HEP for RLE strengthening at home. Will continue to progress towards LTGs.    Personal Factors and Comorbidities  Comorbidity 1;Comorbidity 2;Comorbidity 3+    Comorbidities  Patient with history of CHF, HTN, CAD (2 stents in 2013 and 2003), CVA  (09/2016) with right hemiparesis, incont of urine and bowel,  tob depen, vit D def    Examination-Activity Limitations  Bathing;Locomotion Level;Transfers;Stairs;Stand;Bed Mobility    Examination-Participation Restrictions  Cleaning;Community Activity;Shop;Laundry    Stability/Clinical Decision Making  Evolving/Moderate complexity    Rehab Potential  Good    PT Frequency  Other (comment)   1-2   PT Duration  12 weeks    PT Treatment/Interventions  ADLs/Self Care Home Management;DME Instruction;Gait training;Stair training;Functional mobility training;Therapeutic activities;Therapeutic exercise;Balance training;Neuromuscular re-education;Manual techniques;Passive range of motion    PT Next Visit Plan  LE strengthening (try bent knee fall outs), gait with hemiwalker and AFO. medial/lateral weight shifting towards R, standing balance with head turns, transfers from w/c > mat table.    PT Home Exercise Plan  weight shifting, balance with head turns, sit to stands    Consulted and Agree with Plan of Care  Patient;Family member/caregiver    Family Member Consulted  son       Patient will  benefit from skilled therapeutic intervention in order to improve the following deficits and impairments:  Abnormal gait, Decreased activity tolerance, Decreased  balance, Decreased mobility, Decreased endurance, Decreased range of motion, Decreased strength, Difficulty walking, Obesity  Visit Diagnosis: Unsteadiness on feet  Difficulty in walking, not elsewhere classified  Hemiplegia and hemiparesis following cerebral infarction affecting right dominant side [facility])     Problem List Patient Active Problem List   Diagnosis Date Noted  . Influenza vaccine needed 07/21/2019  . Need for diphtheria-tetanus-pertussis (Tdap) vaccine 07/21/2019  . Ischemic cardiomyopathy 07/21/2019  . History of ischemic stroke without residual deficits 06/02/2019  . Essential hypertension 06/02/2019  . Hyperlipidemia 06/02/2019  . Coronary artery disease involving native coronary artery of native heart without angina pectoris 06/02/2019  . Tobacco dependence 06/02/2019  . Overgrown toenails 06/02/2019  . Bowel and bladder incontinence 06/02/2019  . Decreased breath sounds at right lung base 06/02/2019  . Homeless 06/02/2019  . Vitamin D deficiency 06/02/2019    Drake Leach, PT, DPT  07/26/2019, 12:22 PM  [place] [facility] [address] [place], [place], [address] Phone: [phone]   Fax:  [phone]  Name: Francisco Obrien MRN: [id number] Date of Birth: Aug 23, 1956

## 2019-07-27 ENCOUNTER — Telehealth: Payer: Self-pay

## 2019-07-27 NOTE — Telephone Encounter (Signed)
Pt son came into the office today to speak with pcp regarding the rx that was faxed over to the [facility] for pt for the aso brace.   Pt son states he spoke with Meghan and per Meghan the rx for the leg brace needs to be AFO. Also per she is requesting a rx for a arm sling. Per Meghan pt is needing a right arm sling due to the dislocation and pain at the shoulder when his arm hangs down.   Per Meghan pcp can do an addendum to her note from 07/21/19. Per Meghan she will send me an e-mail for pcp regarding the sling.   Gave pcp the e-mail from [facility]

## 2019-07-28 ENCOUNTER — Encounter: Payer: Self-pay | Admitting: Physical Therapy

## 2019-07-28 ENCOUNTER — Other Ambulatory Visit: Payer: Self-pay

## 2019-07-28 ENCOUNTER — Ambulatory Visit: Payer: Medicaid Other | Admitting: Physical Therapy

## 2019-07-28 DIAGNOSIS — I69351 Hemiplegia and hemiparesis following cerebral infarction affecting right dominant side: Secondary | ICD-10-CM

## 2019-07-28 DIAGNOSIS — R2681 Unsteadiness on feet: Secondary | ICD-10-CM

## 2019-07-28 DIAGNOSIS — R262 Difficulty in walking, not elsewhere classified: Secondary | ICD-10-CM

## 2019-07-28 NOTE — Therapy (Signed)
[facility] [address] [place], [place], [address] Phone: [phone]   Fax:  [phone]  Physical Therapy Treatment  Patient Details  Name: Francisco Obrien MRN: [id number] Date of Birth: June 02, 1956 No data recorded  Encounter Date: 07/28/2019  PT End of Session - 07/28/19 1345    Visit Number  7    Number of Visits  12    Date for PT Re-Evaluation  09/13/19    Authorization Type  MCD asking for 12 visits    Authorization - Visit Number  5    Authorization - Number of Visits  12    PT Start Time  0934    PT Stop Time  1015    PT Time Calculation (min)  41 min    Equipment Utilized During Treatment  Gait belt    Activity Tolerance  Patient tolerated treatment well    Behavior During Therapy  [facility] for tasks assessed/performed       Past Medical History:  Diagnosis Date  . CHF (congestive heart failure) (HCC)   . Coronary artery disease   . Stroke [facility])     History reviewed. No pertinent surgical history.  There were no vitals filed for this visit.  Subjective Assessment - 07/28/19 0938    Subjective  Wearing a splint for [name] today. Doing good today - has tried the bridging exercise at home and it has been going well.    Pertinent History  2016 CVA with Rt hemiparesis    Limitations  Standing                       [facility] Adult PT Treatment/Exercise - 07/28/19 0001      Ambulation/Gait   Ambulation/Gait  Yes    Ambulation/Gait Assistance  4: Min assist;4: Min guard    Ambulation/Gait Assistance Details  Using hemiwalker and Ottobock Walkon Reaction AFO for RLE to help improve foot clearance - min A from therapist to help facilitate weight shifting towards RLE, and support under RUE to decrease pain. Initially helped with increased R knee flexion for swing phase of gait. Cueing for increased step length with LLE due to decreased weight shifting to RLE, pt able to carryover for first couple of  steps, when fatigued pt reverts back to step to pattern. Pt with no episodes of R foot catching with AFO,    Ambulation Distance (Feet)  40 Feet   plus 1 x 35 feet   Assistive device  Hemi-walker   Ottobock Walkon Reaction AFO   Gait Pattern  Decreased arm swing - right;Decreased step length - left;Decreased hip/knee flexion - right;Right circumduction;Decreased stance time - right;Trunk flexed;Lateral hip instability;Step-to pattern;Step-through pattern    Ambulation Surface  Level;Indoor      Neuro Re-ed    Neuro Re-ed Details   At // bars with LUE supporting: massed practice of weight shifting towards RLE and stepping forwards/backwards with LLE in order to focus on increased step length for step through pattern with gait and improved weight shifting to RLE. Facilitation from therapist for weight shifting and support under RUE to decrease RUE pain               PT Short Term Goals - 06/21/19 1324      PT SHORT TERM GOAL #1   Title  Pt will be I and compliant with HEP. (6 weeks 08/02/19)    Status  New      PT SHORT  TERM GOAL #2   Title  Pt will decrease 5TSTS time to less than 30 sec    Baseline  35 sec    Status  New        PT Long Term Goals - 06/21/19 1326      PT LONG TERM GOAL #1   Title  Pt will decrease 5TSTS test to less than 25 sec. (Target for all LTG's 12 weeks 09/13/19)    Baseline  35 sec    Status  New      PT LONG TERM GOAL #2   Title  Pt will be able to ambulate at least 110 ft with hemiwalker.    Baseline  30 ft with hemiwalker    Status  New      PT LONG TERM GOAL #3   Title  He will improrve gait speed for 10 ft to less than 30 seconds    Baseline  39 sec    Status  New            Plan - 07/28/19 1354    Clinical Impression Statement  Focus of today's session was gait training with hemi walker and R AFO and weight shifting towards R with focus on increased step length with LLE. Pt tolerated bouts of gait well with AFO and demonstrated  increased RLE foot clearance. Pt at first demonstrates step through pattern with gait after cueing and manual facilitation from therapist at pelvis , when fatigued pt reverts to step to pattern. Pt at times has difficulty following multi-step verbal commands - pt better understood with tactile cues and visual demonstration. Will continue to progress towards LTGs.    Personal Factors and Comorbidities  Comorbidity 1;Comorbidity 2;Comorbidity 3+    Comorbidities  Patient with history of CHF, HTN, CAD (2 stents in 2013 and 2003), CVA  (09/2016) with right hemiparesis, incont of urine and bowel,  tob depen, vit D def    Examination-Activity Limitations  Bathing;Locomotion Level;Transfers;Stairs;Stand;Bed Mobility    Examination-Participation Restrictions  Cleaning;Community Activity;Shop;Laundry    Stability/Clinical Decision Making  Evolving/Moderate complexity    Rehab Potential  Good    PT Frequency  Other (comment)   1-2   PT Duration  12 weeks    PT Treatment/Interventions  ADLs/Self Care Home Management;DME Instruction;Gait training;Stair training;Functional mobility training;Therapeutic activities;Therapeutic exercise;Balance training;Neuromuscular re-education;Manual techniques;Passive range of motion    PT Next Visit Plan  assess STGs. contiue gait with hemiwalker and WalkOn Reaction AFO. sit <> stands. anterior and posterior and medial/lateral weight shifting towards R, standing balance with head turns, transfers from w/c > mat table.    PT Home Exercise Plan  weight shifting, balance with head turns, sit to stands, bridging    Consulted and Agree with Plan of Care  Patient;Family member/caregiver    Family Member Consulted  son       Patient will benefit from skilled therapeutic intervention in order to improve the following deficits and impairments:  Abnormal gait, Decreased activity tolerance, Decreased balance, Decreased mobility, Decreased endurance, Decreased range of motion, Decreased  strength, Difficulty walking, Obesity  Visit Diagnosis: Unsteadiness on feet  Difficulty in walking, not elsewhere classified  Hemiplegia and hemiparesis following cerebral infarction affecting right dominant side [facility](HCC)     Problem List Patient Active Problem List   Diagnosis Date Noted  . Influenza vaccine needed 07/21/2019  . Need for diphtheria-tetanus-pertussis (Tdap) vaccine 07/21/2019  . Ischemic cardiomyopathy 07/21/2019  . History of ischemic stroke without residual deficits 06/02/2019  .  Essential hypertension 06/02/2019  . Hyperlipidemia 06/02/2019  . Coronary artery disease involving native coronary artery of native heart without angina pectoris 06/02/2019  . Tobacco dependence 06/02/2019  . Overgrown toenails 06/02/2019  . Bowel and bladder incontinence 06/02/2019  . Decreased breath sounds at right lung base 06/02/2019  . Homeless 06/02/2019  . Vitamin D deficiency 06/02/2019    Arliss Journey, PT, DPT 07/28/2019, 1:59 PM  [facility] [address] [place], [place], [address] Phone: [phone]   Fax:  [phone]  Name: Francisco Obrien MRN: [id number] Date of Birth: 12-29-1955

## 2019-07-31 NOTE — Telephone Encounter (Signed)
Refaxed new rx for AFO leg brace, arm sling and addendum office note

## 2019-08-02 ENCOUNTER — Other Ambulatory Visit: Payer: Self-pay

## 2019-08-02 ENCOUNTER — Telehealth: Payer: Self-pay | Admitting: Internal Medicine

## 2019-08-02 ENCOUNTER — Ambulatory Visit: Payer: Medicaid Other | Admitting: Physical Therapy

## 2019-08-02 DIAGNOSIS — R2681 Unsteadiness on feet: Secondary | ICD-10-CM

## 2019-08-02 DIAGNOSIS — I69351 Hemiplegia and hemiparesis following cerebral infarction affecting right dominant side: Secondary | ICD-10-CM

## 2019-08-02 DIAGNOSIS — R262 Difficulty in walking, not elsewhere classified: Secondary | ICD-10-CM

## 2019-08-02 DIAGNOSIS — G8929 Other chronic pain: Secondary | ICD-10-CM

## 2019-08-02 NOTE — Telephone Encounter (Signed)
Patients son Duane Boston called to verify that PCP received documentation for enrollment in the CAP program, please follow up as needed.  [phone]

## 2019-08-02 NOTE — Therapy (Signed)
[facility] [address] [place], [place], [address] Phone: [phone]   Fax:  [phone]  Physical Therapy Treatment  Patient Details  Name: Francisco Obrien MRN: [id number] Date of Birth: [birth year] Referring Provider (PT): Ladell Pier, MD   Encounter Date: 08/02/2019  PT End of Session - 08/02/19 0953    Visit Number  8    Number of Visits  12    Date for PT Re-Evaluation  09/13/19    Authorization Type  MCD asking for 12 visits    Authorization - Visit Number  6    Authorization - Number of Visits  12    PT Start Time  0845    PT Stop Time  0930    PT Time Calculation (min)  45 min    Equipment Utilized During Treatment  Gait belt    Activity Tolerance  Patient tolerated treatment well    Behavior During Therapy  [facility] for tasks assessed/performed       Past Medical History:  Diagnosis Date  . CHF (congestive heart failure) ([place])   . Coronary artery disease   . Stroke [facility])     No past surgical history on file.  There were no vitals filed for this visit.  Subjective Assessment - 08/02/19 0945    Subjective  Relays compliance at home with HEP, no new complaints, denies pain    Pertinent History  2016 CVA with Rt hemiparesis    Limitations  Standing    Currently in Pain?  No/denies         [facility] PT Assessment - 08/02/19 0001      Assessment   Medical Diagnosis  Rt hemiparesis (from CVA in 2016)    Referring Provider (PT)  Ladell Pier, MD      Transfers   Comments  Throughout session performed sit <> stands x15 reps with supervision, one transfer from chair to bed<>bed with supervision      Ambulation/Gait   Ambulation/Gait  Yes    Ambulation/Gait Assistance  4: Min guard    Ambulation/Gait Assistance Details  using hemiwalker and ottobock reaction AFO    Ambulation Distance (Feet)  125 Feet    Gait Pattern  Decreased arm swing - right;Decreased step length - left;Decreased hip/knee  flexion - right;Right circumduction;Decreased stance time - right;Trunk flexed;Lateral hip instability;Step-to pattern;Step-through pattern    Ambulation Surface  Level;Indoor    Gait velocity  18 seconds for 10 ft      Balance   Balance Assessed  Yes      Standardized Balance Assessment   Standardized Balance Assessment  Timed Up and Go Test;Five Times Sit to Stand    Five times sit to stand comments   33 seconds first attempt but not sure if he understood directions completely to retrested at 22 seconds on second attempt                   [facility] Adult PT Treatment/Exercise - 08/02/19 0001      Ambulation/Gait   Gait Comments  cues for longer step through pattern on Lt leg and he was able to show improvements with this.      Neuro Re-ed    Neuro Re-ed Details   seated weight shifting down from upright to down onto his Rt UE holding 5 seconds then pushing back up to upright posture X 10 reps, then went to bars for step ups with Rt leg X 10 reps on 4  inch step with manual faciliitaion at times for Rt leg placement, to clear the step, and to assist quad at the anterior knee as he steps up.               PT Short Term Goals - 08/02/19 0911      PT SHORT TERM GOAL #1   Title  Pt will be I and compliant with HEP. (6 weeks 08/02/19)    Baseline  now met    Status  Achieved      PT SHORT TERM GOAL #2   Title  Pt will decrease 5TSTS time to less than 30 sec    Baseline  35 sec on eval, 22 sec today    Status  Achieved        PT Long Term Goals - 08/02/19 0913      PT LONG TERM GOAL #1   Title  Pt will decrease 5TSTS test to less than 25 sec. (Target for all LTG's 12 weeks 09/13/19)    Baseline  22 sec today    Status  Achieved      PT LONG TERM GOAL #2   Title  Pt will be able to ambulate at least 110 ft with hemiwalker.    Baseline  125 ft today with hemiwalker and AFO    Status  Achieved      PT LONG TERM GOAL #3   Title  He will improrve gait speed for  10 ft to less than 30 seconds    Baseline  18    Status  Achieved      PT LONG TERM GOAL #4   Title  Pt will ambulate 220 ft (2 laps in clinic) with supervision and hemiwalker.    Baseline  now 125 ft    Status  New    Target Date  09/13/19      PT LONG TERM GOAL #5   Title  Pt will ambulate 10 feet in 14 seconds or less to show improved gait speed.    Baseline  now 18 seconds    Status  New    Target Date  09/13/19            Plan - 08/02/19 8413    Clinical Impression Statement  Assessed his goals today and he is making excellent progress with PT and has already met most of his LTG so goals were updated to reflect his progress. Goals initially wrote with lower prognosis in mind since his CVA is chronic. After gait training and cuing for larger step through pattern with Lt leg, he was able to show improved gait pattern, improved gait speed, and improved distance. He remains higly motivated with PT and will continue to benefit from therapy for strenght, balance, gait, decrease caregiver burden, and increase funcitonal abilities.    Personal Factors and Comorbidities  Comorbidity 1;Comorbidity 2;Comorbidity 3+    Comorbidities  Patient with history of CHF, HTN, CAD (2 stents in 2013 and 2003), CVA  (09/2016) with right hemiparesis, incont of urine and bowel,  tob depen, vit D def    Examination-Activity Limitations  Bathing;Locomotion Level;Transfers;Stairs;Stand;Bed Mobility    Examination-Participation Restrictions  Cleaning;Community Activity;Shop;Laundry    Stability/Clinical Decision Making  Evolving/Moderate complexity    Rehab Potential  Good    PT Frequency  Other (comment)   1-2   PT Duration  12 weeks    PT Treatment/Interventions  ADLs/Self Care Home Management;DME Instruction;Gait training;Stair training;Functional mobility training;Therapeutic activities;Therapeutic exercise;Balance training;Neuromuscular re-education;Manual  techniques;Passive range of motion    PT Next  Visit Plan  assess STGs. contiue gait with hemiwalker and WalkOn Reaction AFO. sit <> stands. anterior and posterior and medial/lateral weight shifting towards R, standing balance with head turns, transfers from w/c > mat table.    PT Home Exercise Plan  weight shifting, balance with head turns, sit to stands, bridging    Consulted and Agree with Plan of Care  Patient;Family member/caregiver    Family Member Consulted  son       Patient will benefit from skilled therapeutic intervention in order to improve the following deficits and impairments:  Abnormal gait, Decreased activity tolerance, Decreased balance, Decreased mobility, Decreased endurance, Decreased range of motion, Decreased strength, Difficulty walking, Obesity  Visit Diagnosis: Unsteadiness on feet  Difficulty in walking, not elsewhere classified  Hemiplegia and hemiparesis following cerebral infarction affecting right dominant side (HCC)  Chronic right shoulder pain     Problem List Patient Active Problem List   Diagnosis Date Noted  . Influenza vaccine needed 07/21/2019  . Need for diphtheria-tetanus-pertussis (Tdap) vaccine 07/21/2019  . Ischemic cardiomyopathy 07/21/2019  . History of ischemic stroke without residual deficits 06/02/2019  . Essential hypertension 06/02/2019  . Hyperlipidemia 06/02/2019  . Coronary artery disease involving native coronary artery of native heart without angina pectoris 06/02/2019  . Tobacco dependence 06/02/2019  . Overgrown toenails 06/02/2019  . Bowel and bladder incontinence 06/02/2019  . Decreased breath sounds at right lung base 06/02/2019  . Homeless 06/02/2019  . Vitamin D deficiency 06/02/2019    Silvestre Mesi 08/02/2019, 10:08 AM  [facility] [address] [place], [place], [address] Phone: [phone]   Fax:  [phone]  Name: Francisco Obrien MRN: [id number] Date of Birth: [birth year]

## 2019-08-03 NOTE — Telephone Encounter (Signed)
Returned pt call and lvm making him aware that received paperwork

## 2019-08-04 ENCOUNTER — Telehealth: Payer: Self-pay | Admitting: Internal Medicine

## 2019-08-04 ENCOUNTER — Other Ambulatory Visit: Payer: Self-pay

## 2019-08-04 ENCOUNTER — Encounter: Payer: Self-pay | Admitting: Physical Therapy

## 2019-08-04 ENCOUNTER — Ambulatory Visit: Payer: Medicaid Other | Admitting: Physical Therapy

## 2019-08-04 DIAGNOSIS — I69351 Hemiplegia and hemiparesis following cerebral infarction affecting right dominant side: Secondary | ICD-10-CM

## 2019-08-04 DIAGNOSIS — R2681 Unsteadiness on feet: Secondary | ICD-10-CM

## 2019-08-04 DIAGNOSIS — R262 Difficulty in walking, not elsewhere classified: Secondary | ICD-10-CM

## 2019-08-04 NOTE — Telephone Encounter (Signed)
Francisco Obrien with [facility] called wanting to verify if cmn [place] address form was received please follow up.

## 2019-08-04 NOTE — Telephone Encounter (Signed)
-----   Message from Jackelyn Knife, [place] sent at 07/21/2019  1:37 PM EDT ----- Please contact pt and schedule a 2 month in person f/u(morning)

## 2019-08-04 NOTE — Therapy (Signed)
[facility] [address] [place], [place], [address] Phone: [phone]   Fax:  [phone]  Physical Therapy Treatment  Patient Details  Name: Francisco Obrien MRN: [id number] Date of Birth: 07-Sep-1956 Referring Provider (PT): Ladell Pier, MD   Encounter Date: 08/04/2019  PT End of Session - 08/04/19 1210    Visit Number  9    Number of Visits  13   eval plus 12 visits   Date for PT Re-Evaluation  09/13/19    Authorization Type  MCD asking for 12 visits    Authorization - Visit Number  8    Authorization - Number of Visits  12    PT Start Time  4315    PT Stop Time  1015    PT Time Calculation (min)  41 min    Equipment Utilized During Treatment  Gait belt    Activity Tolerance  Patient tolerated treatment well    Behavior During Therapy  [facility] for tasks assessed/performed       Past Medical History:  Diagnosis Date  . CHF (congestive heart failure) ([place])   . Coronary artery disease   . Stroke [facility])     History reviewed. No pertinent surgical history.  There were no vitals filed for this visit.  Subjective Assessment - 08/04/19 0939    Subjective  Felt great after last session while walking. No falls. Exercise program has been going well. States that he will be receiving his sling and his brace on October 6th.    Pertinent History  2016 CVA with Rt hemiparesis    Limitations  Standing    Currently in Pain?  No/denies                       [facility] Adult PT Treatment/Exercise - 08/04/19 1212      Transfers   Stand Pivot Transfers  5: Supervision    Stand Pivot Transfer Details (indicate cue type and reason)  needs hemiwalker, from mat table <> w/c, performed without R AFO donned    Comments  Standing at countertop x5 minutes: lateral weight shifting to R and L, with holds to R side. 3 x 5 reps mini squats with LUE support on countertop. Min guard       Ambulation/Gait   Ambulation/Gait   Yes    Ambulation/Gait Assistance  4: Min guard    Ambulation/Gait Assistance Details  Pt ambulated 115 ft with mostly consistent step through pattern, intermittent cueing for weight shifting to RLE and standing tall in order for larger step length on LLE. With R AFO, pt demonstrated no foot catching during gait, however his R foot is held in external rotation with brace and also for transfers without AFO on. Explained to pt and pt's son that this is due to pt's decreased strength/support at ankle and that pt's custom AFO will address this. Support from therapist provided under pt's RUE.     Ambulation Distance (Feet)  115 Feet   plus 25 ft   Assistive device  Hemi-walker   with R Ottobock Walkon Reaction AFO   Gait Pattern  Decreased arm swing - right;Decreased step length - left;Decreased hip/knee flexion - right;Right circumduction;Decreased stance time - right;Trunk flexed;Lateral hip instability;Step-to pattern;Step-through pattern    Ambulation Surface  Level;Indoor    Gait Comments  set up 2 chairs for pt to practice figure 8 turns x2 reps - cues for increased step length and wider BOS when  going around turns       Self-Care   Self-Care  Other Self-Care Comments    Other Self-Care Comments   Pt mentioned wanting to also receive OT- therapist stated that he has no other appointments with OT and that he has past the date for [facility] certification with OT, therapist said that they would reach out to OT for possible request of more visits.                PT Short Term Goals - 08/02/19 0911      PT SHORT TERM GOAL #1   Title  Pt will be I and compliant with HEP. (6 weeks 08/02/19)    Baseline  now met    Status  Achieved      PT SHORT TERM GOAL #2   Title  Pt will decrease 5TSTS time to less than 30 sec    Baseline  35 sec on eval, 22 sec today    Status  Achieved        PT Long Term Goals - 08/02/19 0913      PT LONG TERM GOAL #1   Title  Pt will decrease 5TSTS test to  less than 25 sec. (Target for all LTG's 12 weeks 09/13/19)    Baseline  22 sec today    Status  Achieved      PT LONG TERM GOAL #2   Title  Pt will be able to ambulate at least 110 ft with hemiwalker.    Baseline  125 ft today with hemiwalker and AFO    Status  Achieved      PT LONG TERM GOAL #3   Title  He will improrve gait speed for 10 ft to less than 30 seconds    Baseline  18    Status  Achieved      PT LONG TERM GOAL #4   Title  Pt will ambulate 220 ft (2 laps in clinic) with supervision and hemiwalker.    Baseline  now 125 ft    Status  New    Target Date  09/13/19      PT LONG TERM GOAL #5   Title  Pt will ambulate 10 feet in 14 seconds or less to show improved gait speed.    Baseline  now 18 seconds    Status  New    Target Date  09/13/19            Plan - 08/04/19 1230    Clinical Impression Statement  Focus of today's session was gait training with R AFO and hemiwalker and LE strengthening. Pt demonstrating improved step through pattern today with gait and able to ambulate 115' - only demonstrated step to pattern today towards the end with fatigue or while performing turns. Pt with increased fatigue today after performing 3 x 5 reps of mini squats at the countertop. Discussed potentially only using 1 visit next week and saving other visit for when pt's custom AFO and [employer] sling comes in at [facility] at the beginning of October. Will continue to progress towards LTGs.    Personal Factors and Comorbidities  Comorbidity 1;Comorbidity 2;Comorbidity 3+    Comorbidities  Patient with history of CHF, HTN, CAD (2 stents in 2013 and 2003), CVA  (09/2016) with right hemiparesis, incont of urine and bowel,  tob depen, vit D def    Examination-Activity Limitations  Bathing;Locomotion Level;Transfers;Stairs;Stand;Bed Mobility    Examination-Participation Restrictions  Cleaning;Community Activity;Shop;Laundry    Stability/Clinical Decision  Making  Evolving/Moderate complexity     Rehab Potential  Good    PT Frequency  Other (comment)   1-2   PT Duration  12 weeks    PT Treatment/Interventions  ADLs/Self Care Home Management;DME Instruction;Gait training;Stair training;Functional mobility training;Therapeutic activities;Therapeutic exercise;Balance training;Neuromuscular re-education;Manual techniques;Passive range of motion    PT Next Visit Plan  Mini squats and standing tolerance at counter top. add any other execises to HEP. contiue gait with hemiwalker and WalkOn Reaction AFO. sit <> stands. anterior and posterior and medial/lateral weight shifting towards R, standing balance with head turns    PT Home Exercise Plan  weight shifting, balance with head turns, sit to stands, bridging    Consulted and Agree with Plan of Care  Patient;Family member/caregiver    Family Member Consulted  son       Patient will benefit from skilled therapeutic intervention in order to improve the following deficits and impairments:  Abnormal gait, Decreased activity tolerance, Decreased balance, Decreased mobility, Decreased endurance, Decreased range of motion, Decreased strength, Difficulty walking, Obesity  Visit Diagnosis: Unsteadiness on feet  Difficulty in walking, not elsewhere classified  Hemiplegia and hemiparesis following cerebral infarction affecting right dominant side [facility])     Problem List Patient Active Problem List   Diagnosis Date Noted  . Influenza vaccine needed 07/21/2019  . Need for diphtheria-tetanus-pertussis (Tdap) vaccine 07/21/2019  . Ischemic cardiomyopathy 07/21/2019  . History of ischemic stroke without residual deficits 06/02/2019  . Essential hypertension 06/02/2019  . Hyperlipidemia 06/02/2019  . Coronary artery disease involving native coronary artery of native heart without angina pectoris 06/02/2019  . Tobacco dependence 06/02/2019  . Overgrown toenails 06/02/2019  . Bowel and bladder incontinence 06/02/2019  . Decreased breath sounds at  right lung base 06/02/2019  . Homeless 06/02/2019  . Vitamin D deficiency 06/02/2019    Arliss Journey, PT, DPT 08/04/2019, 12:33 PM  [facility] [address] [place], [place], [address] Phone: [phone]   Fax:  [phone]  Name: Ceferino Lang MRN: [id number] Date of Birth: Mar 19, 1956

## 2019-08-04 NOTE — Telephone Encounter (Signed)
Pt scheduled  

## 2019-08-07 ENCOUNTER — Ambulatory Visit: Payer: Medicaid Other | Admitting: Physical Therapy

## 2019-08-09 ENCOUNTER — Other Ambulatory Visit: Payer: Self-pay

## 2019-08-09 ENCOUNTER — Telehealth: Payer: Self-pay | Admitting: Internal Medicine

## 2019-08-09 ENCOUNTER — Encounter: Payer: Self-pay | Admitting: Physical Therapy

## 2019-08-09 ENCOUNTER — Ambulatory Visit: Payer: Medicaid Other | Admitting: Physical Therapy

## 2019-08-09 ENCOUNTER — Ambulatory Visit: Payer: Medicaid Other | Admitting: Pharmacist

## 2019-08-09 DIAGNOSIS — R2681 Unsteadiness on feet: Secondary | ICD-10-CM

## 2019-08-09 DIAGNOSIS — I69351 Hemiplegia and hemiparesis following cerebral infarction affecting right dominant side: Secondary | ICD-10-CM

## 2019-08-09 DIAGNOSIS — R262 Difficulty in walking, not elsewhere classified: Secondary | ICD-10-CM

## 2019-08-09 NOTE — Therapy (Signed)
[facility] [address] [place], [place], [address] Phone: [phone]   Fax:  [phone]  Physical Therapy Treatment  Patient Details  Name: Francisco Obrien MRN: [id number] Date of Birth: 10-20-1956 Referring Provider (PT): Ladell Pier, MD   Encounter Date: 08/09/2019    08/09/19 1034  PT Visits / Re-Eval  Visit Number 10  Number of Visits 18 (eval plus 12 visits)  Date for PT Re-Evaluation 10/24/19  Authorization  Authorization Type Medicaid - checking new authorization to continue  Authorization - Visit Number 9  Authorization - Number of Visits 12  PT Time Calculation  PT Start Time 0935  PT Stop Time 1018  PT Time Calculation (min) 43 min  PT - End of Session  Equipment Utilized During Treatment Gait belt  Activity Tolerance Patient tolerated treatment well  Behavior During Therapy [facility] for tasks assessed/performed     Past Medical History:  Diagnosis Date  . CHF (congestive heart failure) ([place])   . Coronary artery disease   . Stroke [facility])     History reviewed. No pertinent surgical history.  There were no vitals filed for this visit.  Subjective Assessment - 08/09/19 0939    Subjective  No falls. No new complaints. Today is pt's last session before he receives his R AFO on October 6th.    Pertinent History  2016 CVA with Rt hemiparesis    Limitations  Standing    Currently in Pain?  No/denies        08/13/19 0001  Transfers  Transfers Sit to Stand;Stand to Sit;Stand Pivot Transfers  Sit to Stand 5: Supervision  Sit to Stand Details Verbal cues for technique;Verbal cues for sequencing  Sit to Stand Details (indicate cue type and reason) At counter arm, needs L arm to push up from arm rest on w/c   Stand Pivot Transfers 5: Supervision  Stand Pivot Transfer Details (indicate cue type and reason) Supervision with hemiwalker, cues for foot placement. Pt attempted stand pivot transfer from  w/c > mat table with no AD transferring to L side - needed min A,                     Access Code: [email or web address]  URL: [email or web address]  Date: 08/09/2019  Prepared by: Janann August   Exercises Side to Side Weight Shift with Counter Support - 10 reps - 3 sets - 2x daily - 6x weekly Staggered Stance Forward Backward Weight Shift with Counter Support - 10 reps - 3 sets - 2x daily - 6x weekly - reviewed technique, 2 x 10 reps B.  Sit to Stand with Counter Support - 10 reps - 3 sets - 2x daily - 6x weekly Standing with Head Rotation - 10 reps - 3 sets - 2x daily - 6x weekly Supine Bridge - 5 reps - 2 sets - 2x daily - 7x weekly - reviewed technique and educated pt's son on how to help position RLE. 2 x 5 reps, tactile and verbal cues to help push up first with LLE Bent Knee Fallouts - 5 reps - 3 sets - 1x daily - 7x weekly - 3 x 5 reps, visual and verbal cues for technique on RLE  Supine Hip Adduction Isometric with Ball - 5 reps - 3 sets - 2x daily - 7x weekly - 2 x 5 reps with pillow in hooklying position, cues for technique.  Mini Squat with Counter Support - 5 reps - 2 sets - 2x  daily - 7x weekly - 3 x 5 reps, rest break taken in between each 5 reps due to fatigue, cues for technique. Educated pt's son on where to guard (on R side) and cues to perform correctly at home.       PT Education - 08/09/19 1033    Education Details  additions and reviewed HEP    Person(s) Educated  Patient;Child(ren)    Methods  Explanation;Demonstration;Handout    Comprehension  Verbalized understanding;Returned demonstration;Need further instruction       PT Short Term Goals - 08/02/19 0911      PT SHORT TERM GOAL #1   Title  Pt will be I and compliant with HEP. (6 weeks 08/02/19)    Baseline  now met    Status  Achieved      PT SHORT TERM GOAL #2   Title  Pt will decrease 5TSTS time to less than 30 sec    Baseline  35 sec on eval, 22 sec today    Status  Achieved         PT Long Term Goals - 08/02/19 0913      PT LONG TERM GOAL #1   Title  Pt will decrease 5TSTS test to less than 25 sec. (Target for all LTG's 12 weeks 09/13/19)    Baseline  22 sec today    Status  Achieved      PT LONG TERM GOAL #2   Title  Pt will be able to ambulate at least 110 ft with hemiwalker.    Baseline  125 ft today with hemiwalker and AFO    Status  Achieved      PT LONG TERM GOAL #3   Title  He will improrve gait speed for 10 ft to less than 30 seconds    Baseline  18    Status  Achieved      PT LONG TERM GOAL #4   Title  Pt will ambulate 220 ft (2 laps in clinic) with supervision and hemiwalker.    Baseline  now 125 ft    Status  New    Target Date  09/13/19      PT LONG TERM GOAL #5   Title  Pt will ambulate 10 feet in 14 seconds or less to show improved gait speed.    Baseline  now 18 seconds    Status  New    Target Date  09/13/19       Revised LTGs for re-authorization:    PT Long Term Goals - 08/13/19 2030      PT LONG TERM GOAL #1   Title  Pt will decrease 5TSTS test to less than 20 sec. to demonstrate improved LE strength. (Target for all LTG's 10/24/19 )    Baseline  currently 22 seconds on 08/02/19    Time  8    Period  --   visits   Status  New      PT LONG TERM GOAL #2   Title  Patient and pt's son will be independent with final HEP in order to build upon functional gains made in therapy.    Baseline  currently needs moderate cues for HEP    Time  8   visits   Status  New      PT LONG TERM GOAL #3   Title  Patient will perform stand pivot transfer with R AFO and hemi walker vs. no AD with mod I in order to decrease care giver  burden.    Baseline  supervision with hemiwalker for transfer - verbal cues for foot placement    Time  8   visits   Status  New      PT LONG TERM GOAL #4   Title  Pt will ambulate 230 ft (2 laps in clinic) with supervision and hemiwalker with R AFO in order to improve household mobility.    Baseline  now  125 ft with hemiwalker and R AFO    Time  8    Period  --   visits   Status  New      PT LONG TERM GOAL #5   Title  Pt will ambulate 10 feet in 14 seconds or less to show improved gait speed.    Baseline  now 18 seconds on 08/02/19    Time  8    Period  --   visits   Status  New      Additional Long Term Goals   Additional Long Term Goals  Yes      PT LONG TERM GOAL #6   Title  Patient will perform static standing at countertop with no vs. single UE support for at least 6 minutes with supervision in order to improve endurance for ADLs.    Baseline  not yet formally assessed    Time  8    Period  --   visits   Status  New         08/13/19 2053  Plan  Clinical Impression Statement Focus of today's session was finalizing HEP for LE strength and balance.  Added bent knee fallouts, mini squats, and hooklying hip ADD. Pt needed verbal and tactile cues in order to perform - educated son on cues and assistance for exercises for home. Educated on importance of performing daily especially since pt will have a break from therapy until October 6th when pt receives his AFO.  Pt is now able to walk 115' with hemiwalker and R AFO with min guard and w/c follow and has decreased his 5 time sit <> stand score to 22 seconds. Will request more visits for 1x week for 8 weeks for gait training, LE strengthening, balance and transfer training in order to decrease caregiver burden, improve safety, and increase independence.  Personal Factors and Comorbidities Comorbidity 1;Comorbidity 2;Comorbidity 3+  Comorbidities Patient with history of CHF, HTN, CAD (2 stents in 2013 and 2003), CVA  (09/2016) with right hemiparesis, incont of urine and bowel,  tob depen, vit D def  Examination-Activity Limitations Bathing;Locomotion Level;Transfers;Stairs;Stand;Bed Mobility  Examination-Participation Restrictions Cleaning;Community Activity;Shop;Laundry  Pt will benefit from skilled therapeutic intervention in order to  improve on the following deficits Abnormal gait;Decreased activity tolerance;Decreased balance;Decreased mobility;Decreased endurance;Decreased range of motion;Decreased strength;Difficulty walking;Obesity  Stability/Clinical Decision Making Evolving/Moderate complexity  Rehab Potential Good  PT Frequency 1x / week (1-2)  PT Duration 8 weeks  PT Treatment/Interventions ADLs/Self Care Home Management;DME Instruction;Gait training;Stair training;Functional mobility training;Therapeutic activities;Therapeutic exercise;Balance training;Neuromuscular re-education;Manual techniques;Passive range of motion  PT Next Visit Plan Review HEP. Gait and transfer training with new AFO.  PT Home Exercise Plan [email or web address]  Consulted and Agree with Plan of Care Patient;Family member/caregiver  Family Member Consulted son     Patient will benefit from skilled therapeutic intervention in order to improve the following deficits and impairments:     Visit Diagnosis: Unsteadiness on feet  Difficulty in walking, not elsewhere classified  Hemiplegia and hemiparesis following cerebral infarction affecting right dominant side ([place])  Problem List Patient Active Problem List   Diagnosis Date Noted  . Influenza vaccine needed 07/21/2019  . Need for diphtheria-tetanus-pertussis (Tdap) vaccine 07/21/2019  . Ischemic cardiomyopathy 07/21/2019  . History of ischemic stroke without residual deficits 06/02/2019  . Essential hypertension 06/02/2019  . Hyperlipidemia 06/02/2019  . Coronary artery disease involving native coronary artery of native heart without angina pectoris 06/02/2019  . Tobacco dependence 06/02/2019  . Overgrown toenails 06/02/2019  . Bowel and bladder incontinence 06/02/2019  . Decreased breath sounds at right lung base 06/02/2019  . Homeless 06/02/2019  . Vitamin D deficiency 06/02/2019    Arliss Journey, PT, DPT 08/09/2019, 10:39 AM  [facility] [address] [place], [place], [address] Phone: [phone]   Fax:  [phone]  Name: Allie Gerhold MRN: [id number] Date of Birth: 1956-07-23

## 2019-08-09 NOTE — Telephone Encounter (Signed)
Michelle with [facility] called wanting to verify if cmn [place] address form was received please follow up. °

## 2019-08-09 NOTE — Patient Instructions (Signed)
Access Code: [email or web address]  URL: [email or web address]  Date: 08/09/2019  Prepared by: Janann August   Exercises Side to Side Weight Shift with Counter Support - 10 reps - 3 sets - 2x daily - 6x weekly Staggered Stance Forward Backward Weight Shift with Counter Support - 10 reps - 3 sets - 2x daily - 6x weekly Sit to Stand with Counter Support - 10 reps - 3 sets - 2x daily - 6x weekly Standing with Head Rotation - 10 reps - 3 sets - 2x daily - 6x weekly Supine Bridge - 5 reps - 2 sets - 2x daily - 7x weekly Bent Knee Fallouts - 5 reps - 3 sets - 1x daily - 7x weekly Supine Hip Adduction Isometric with Ball - 5 reps - 3 sets - 2x daily - 7x weekly Mini Squat with Counter Support - 5 reps - 2 sets - 2x daily - 7x weekly

## 2019-08-09 NOTE — Telephone Encounter (Signed)
Dr Wynetta Emery, do you have a [facility] [place] form for this patient currently?

## 2019-08-16 ENCOUNTER — Telehealth: Payer: Self-pay | Admitting: Internal Medicine

## 2019-08-16 NOTE — Telephone Encounter (Signed)
Sharyn Lull with [facility] called back for an update. Please follow up.

## 2019-08-17 NOTE — Addendum Note (Signed)
Addended by: Arliss Journey on: 08/17/2019 03:23 PM   Modules accepted: Orders

## 2019-08-18 NOTE — Telephone Encounter (Signed)
Sharyn Lull from [facility] called to request a form from 08/09/2019 re-faxed she states that she never received it, please follow up  Phone-[phone] Fax [phone]

## 2019-08-18 NOTE — Telephone Encounter (Signed)
Paperwork has been faxed both documents were faxed on 08/17/2019

## 2019-08-22 ENCOUNTER — Telehealth: Payer: Self-pay | Admitting: Internal Medicine

## 2019-08-22 ENCOUNTER — Other Ambulatory Visit: Payer: Self-pay

## 2019-08-22 ENCOUNTER — Ambulatory Visit: Payer: Medicaid Other | Admitting: Physical Therapy

## 2019-08-22 DIAGNOSIS — R262 Difficulty in walking, not elsewhere classified: Secondary | ICD-10-CM | POA: Insufficient documentation

## 2019-08-22 DIAGNOSIS — I69351 Hemiplegia and hemiparesis following cerebral infarction affecting right dominant side: Secondary | ICD-10-CM | POA: Insufficient documentation

## 2019-08-22 DIAGNOSIS — R2681 Unsteadiness on feet: Secondary | ICD-10-CM | POA: Insufficient documentation

## 2019-08-22 NOTE — Telephone Encounter (Signed)
Follow up   Francisco Obrien from [facility] is calling to check on the status of the pt's forms she faxed over. Please f/u

## 2019-08-22 NOTE — Telephone Encounter (Signed)
Patient son came in wanting to get an update on paperwork for [facility] informed messaged will be passed along. Please follow up.

## 2019-08-22 NOTE — Telephone Encounter (Signed)
Information has been faxed to all sites

## 2019-08-22 NOTE — Telephone Encounter (Signed)
Paperwork has been faxed and transmission log recieved

## 2019-08-22 NOTE — Therapy (Signed)
[facility] [address] [place], [place], [address] Phone: [phone]   Fax:  [phone]  Physical Therapy Treatment  Patient Details  Name: Francisco Obrien MRN: [id number] Date of Birth: 1956/10/04 Referring Provider (PT): Ladell Pier, MD   Encounter Date: 08/22/2019    Past Medical History:  Diagnosis Date  . CHF (congestive heart failure) ([place])   . Coronary artery disease   . Stroke [facility])     No past surgical history on file.  There were no vitals filed for this visit.     Patient arrived 10-15 late for appointment today. Did not yet receive his brace from [facility] because they were not faxed the proper paperwork from pt's PCP. Per Epic notes, paperwork was faxed to [facility] about an hour ago. Per pt's son, hopefully he should receive his brace tomorrow.  Due to pt's insurance and limited visits for Medicaid, decided to save this visit for later in the POC in order to be able to take advantage of a full 45 minute session and once pt receives his own brace. Pt and pt's son in agreement. No charge today.                          PT Short Term Goals - 08/02/19 0911      PT SHORT TERM GOAL #1   Title  Pt will be I and compliant with HEP. (6 weeks 08/02/19)    Baseline  now met    Status  Achieved      PT SHORT TERM GOAL #2   Title  Pt will decrease 5TSTS time to less than 30 sec    Baseline  35 sec on eval, 22 sec today    Status  Achieved        PT Long Term Goals - 08/13/19 2030      PT LONG TERM GOAL #1   Title  Pt will decrease 5TSTS test to less than 20 sec. to demonstrate improved LE strength. (Target for all LTG's 10/24/19 )    Baseline  currently 22 seconds on 08/02/19    Time  8    Period  --   visits   Status  New      PT LONG TERM GOAL #2   Title  Patient and pt's son will be independent with final HEP in order to build upon functional gains made in therapy.    Baseline  currently needs moderate cues for HEP    Time  8   visits   Status  New      PT LONG TERM GOAL #3   Title  Patient will perform stand pivot transfer with R AFO and hemi walker vs. no AD with mod I in order to decrease care giver burden.    Baseline  supervision with hemiwalker for transfer - verbal cues for foot placement    Time  8   visits   Status  New      PT LONG TERM GOAL #4   Title  Pt will ambulate 230 ft (2 laps in clinic) with supervision and hemiwalker with R AFO in order to improve household mobility.    Baseline  now 125 ft with hemiwalker and R AFO    Time  8    Period  --   visits   Status  New      PT LONG TERM GOAL #5   Title  Pt will ambulate 10 feet in 14 seconds or less to show improved gait speed.    Baseline  now 18 seconds on 08/02/19    Time  8    Period  --   visits   Status  New      Additional Long Term Goals   Additional Long Term Goals  Yes      PT LONG TERM GOAL #6   Title  Patient will perform static standing at countertop with no vs. single UE support for at least 6 minutes with supervision in order to improve endurance for ADLs.    Baseline  not yet formally assessed    Time  8    Period  --   visits   Status  New              Patient will benefit from skilled therapeutic intervention in order to improve the following deficits and impairments:     Visit Diagnosis: Unsteadiness on feet  Hemiplegia and hemiparesis following cerebral infarction affecting right dominant side ([facility])  Difficulty in walking, not elsewhere classified     Problem List Patient Active Problem List   Diagnosis Date Noted  . Influenza vaccine needed 07/21/2019  . Need for diphtheria-tetanus-pertussis (Tdap) vaccine 07/21/2019  . Ischemic cardiomyopathy 07/21/2019  . History of ischemic stroke without residual deficits 06/02/2019  . Essential hypertension 06/02/2019  . Hyperlipidemia 06/02/2019  . Coronary artery disease involving native  coronary artery of native heart without angina pectoris 06/02/2019  . Tobacco dependence 06/02/2019  . Overgrown toenails 06/02/2019  . Bowel and bladder incontinence 06/02/2019  . Decreased breath sounds at right lung base 06/02/2019  . Homeless 06/02/2019  . Vitamin D deficiency 06/02/2019    Arliss Journey, PT, DPT 08/22/2019, 6:15 PM  [facility] [address] [place], [place], [address] Phone: [phone]   Fax:  [phone]  Name: Christipher Rieger MRN: [id number] Date of Birth: [birth year]

## 2019-08-22 NOTE — Telephone Encounter (Signed)
Forwarding to Dr. Johnson.

## 2019-08-24 ENCOUNTER — Encounter: Payer: Self-pay | Admitting: Cardiology

## 2019-08-24 ENCOUNTER — Ambulatory Visit (INDEPENDENT_AMBULATORY_CARE_PROVIDER_SITE_OTHER): Payer: Medicaid Other | Admitting: Cardiology

## 2019-08-24 ENCOUNTER — Other Ambulatory Visit: Payer: Self-pay

## 2019-08-24 VITALS — BP 147/81 | HR 62 | Temp 97.2°F | Ht 71.0 in | Wt 120.2 lb

## 2019-08-24 DIAGNOSIS — I10 Essential (primary) hypertension: Secondary | ICD-10-CM | POA: Diagnosis not present

## 2019-08-24 DIAGNOSIS — Z8673 Personal history of transient ischemic attack (TIA), and cerebral infarction without residual deficits: Secondary | ICD-10-CM

## 2019-08-24 DIAGNOSIS — Z720 Tobacco use: Secondary | ICD-10-CM

## 2019-08-24 DIAGNOSIS — I5042 Chronic combined systolic (congestive) and diastolic (congestive) heart failure: Secondary | ICD-10-CM

## 2019-08-24 DIAGNOSIS — Z716 Tobacco abuse counseling: Secondary | ICD-10-CM | POA: Diagnosis not present

## 2019-08-24 DIAGNOSIS — I2510 Atherosclerotic heart disease of native coronary artery without angina pectoris: Secondary | ICD-10-CM

## 2019-08-24 DIAGNOSIS — I255 Ischemic cardiomyopathy: Secondary | ICD-10-CM | POA: Diagnosis not present

## 2019-08-24 DIAGNOSIS — Z79899 Other long term (current) drug therapy: Secondary | ICD-10-CM | POA: Diagnosis not present

## 2019-08-24 MED ORDER — LISINOPRIL 20 MG PO TABS: 20.0000 mg | ORAL_TABLET | Freq: Every day | ORAL | 11 refills | Status: DC

## 2019-08-24 NOTE — Patient Instructions (Addendum)
Medication Instructions:  Increase: Lisinopril to 20 mg daily  If you need a refill on your cardiac medications before your next appointment, please call your pharmacy.   Lab work: Your physician recommends that you return for lab work in 2 weeks (BMP)  If you have labs (blood work) drawn today and your tests are completely normal, you will receive your results only by: [name] MyChart Message (if you have MyChart) OR . A paper copy in the mail If you have any lab test that is abnormal or we need to change your treatment, we will call you to review the results.  Testing/Procedures: None  Follow-Up: At [facility], you and your health needs are our priority.  As part of our continuing mission to provide you with exceptional heart care, we have created designated Provider Care Teams.  These Care Teams include your primary Cardiologist (physician) and Advanced Practice Providers (APPs -  Physician Assistants and Nurse Practitioners) who all work together to provide you with the care you need, when you need it. You will need a follow up appointment in 3 months.  Please call our office 2 months in advance to schedule this appointment.  You may see Dr. Harrell Gave or one of the following Advanced Practice Providers on your designated Care Team:   Rosaria Ferries, PA-C . Jory Sims, DNP, ANP  Your physician recommends that you schedule a follow-up appointment in 2 weeks with Pharm D.   Do the following things EVERY DAY:  1) Weigh yourself EVERY morning after you go to the bathroom but before you eat or drink anything. Write this number down in a weight log/diary. If you gain 3 pounds overnight or 5 pounds in a week, call the office.  2) Take your medicines as prescribed. If you have concerns about your medications, please call us before you stop taking them.   3) Eat low salt foods-Limit salt (sodium) to 2000 mg per day. This will help prevent your body from holding onto fluid. Read food  labels as many processed foods have a lot of sodium, especially canned goods and prepackaged meats. If you would like some assistance choosing low sodium foods, we would be happy to set you up with a nutritionist.  4) Stay as active as you can everyday. Staying active will give you more energy and make your muscles stronger. Start with 5 minutes at a time and work your way up to 30 minutes a day. Break up your activities--do some in the morning and some in the afternoon. Start with 3 days per week and work your way up to 5 days as you can.  If you have chest pain, feel short of breath, dizzy, or lightheaded, STOP. If you don't feel better after a short rest, call 911. If you do feel better, call the office to let us know you have symptoms with exercise.  5) Limit all fluids for the day to less than 2 liters. Fluid includes all drinks, coffee, juice, ice chips, soup, jello, and all other liquids.

## 2019-08-24 NOTE — Progress Notes (Signed)
Cardiology Office Note:    Date:  08/24/2019   ID:  Francisco Obrien, DOB 10/29/1956, MRN [id number]  PCP:  Francisco Obrien, Francisco B, MD  Cardiologist:  Jodelle RedBridgette Tabius Rood, MD  Referring MD: Francisco Obrien, Francisco B, MD   CC: new patient evaluation  History of Present Illness:    Francisco Obrien is a 63 y.o. male with a hx of chronic systolic and diastolic heart failure, HTN, CAD s/p PCI 2003, 2013, CVA 2017, tobacco use who is seen as a new consult at the request of Francisco Obrien, Francisco B, MD for the evaluation and management of heart failure and CAD.  Cardiac history: Echo 07/21/2019 with WMA and EF 25-30%. Calcified Aov/MV but no significant valve regurg/stenosis noted. From [place], prior treatments not available to me, reported from patient's history  Today: Doing well overall, staying safe from Covid. Here with son today. History obtained from both  First told his heart was bad in 2003. Thinks he had a stroke at this time, was told his heart pump function was low. Thinks he had a stent placed at this time. Did well for a while, but about 10 years later had a heart attack, had another stent placed. Had a stroke in 2017, told it was ischemic (not hemorrhagic). Has had residual deficits from this.   Reports that he wore a vest for about a year after his heart attack. Was told after a year that he didn't need it any more.   Has had intermittent RLE swelling since the stroke. No shortness of breath. Weight fluctuates.   HTN: this AM was working out before he came. BP at home is consistent with today's reading. If he misses a dose, can be up to 180 systolic. Was on lisinopril 20 mg in [place], on 10 mg here.   HF: since 2003. Sounds like he worse lifevest for a year after MI. Discussed medications, ICD, heart failure education today.  CAD/hyperlipidemia: no chest pain. Takes baby aspirin every day, no side effects. Has been on clopidogrel for years, no issues. Was on atorvastatin 40 mg in [place], continued here. Takes  every night. On atorvastatin 40 mg, LDL 152.  Tobacco: 2-3 cigarettes/day. Interested in quitting.   Denies chest pain, shortness of breath at rest except for early morning with getting from bed to chair. No PND, orthopnea, LE edema or unexpected weight gain. No syncope or palpitations.  Past Medical History:  Diagnosis Date  . CHF (congestive heart failure) (HCC)   . Coronary artery disease   . Stroke [facility](HCC)     No past surgical history on file.  Current Medications: Current Outpatient Medications on File Prior to Visit  Medication Sig  . albuterol (VENTOLIN HFA) 108 (90 Base) MCG/ACT inhaler Inhale 2 puffs into the lungs every 6 (six) hours as needed for wheezing or shortness of breath.  [name]. amLODipine (NORVASC) 10 MG tablet Take 1 tablet (10 mg total) by mouth daily.  [name]. aspirin 81 MG tablet Take 1 tablet (81 mg total) by mouth daily.  [name]. atorvastatin (LIPITOR) 40 MG tablet Take 1 tablet (40 mg total) by mouth at bedtime.  . baclofen (LIORESAL) 10 MG tablet Take 1 tablet (10 mg total) by mouth 3 (three) times daily.  . carvedilol (COREG) 12.5 MG tablet Take 1 tablet (12.5 mg total) by mouth 2 (two) times daily with a meal.  . clopidogrel (PLAVIX) 75 MG tablet Take 1 tablet (75 mg total) by mouth daily.  . Misc. Devices MISC Adult sized diaper-large.  Dispense  1 box with refills. Dx: bowel and bladder incontinence  . nicotine polacrilex (NICORETTE) 2 MG gum Take 1 each (2 mg total) by mouth as needed for smoking cessation.  . ergocalciferol (VITAMIN D2) 1.25 MG (50000 UT) capsule Take 1 capsule (50,000 Units total) by mouth once a week. (Patient not taking: Reported on 08/24/2019)  . folic acid (FOLVITE) 1 MG tablet Take 1 mg by mouth daily.   No current facility-administered medications on file prior to visit.      Allergies:   Patient has no known allergies.   Social History   Tobacco Use  . Smoking status: Current Every Day Smoker    Packs/day: 0.25    Years: 45.00    Pack  years: 11.25  . Smokeless tobacco: Never Used  Substance Use Topics  . Alcohol use: Yes    Comment: occasionally  . Drug use: No    Family History: family history includes Diabetes in his father and mother; Heart disease in his father and mother.  ROS:   Please see the history of present illness.  Additional pertinent ROS: Constitutional: Negative for chills, fever, night sweats, unintentional weight loss  HENT: Negative for ear pain and hearing loss.   Eyes: Negative for loss of vision and eye pain.  Respiratory: Negative for cough, sputum, wheezing.   Cardiovascular: See HPI. Gastrointestinal: Negative for abdominal pain, melena, and hematochezia.  Genitourinary: Negative for dysuria and hematuria.  Musculoskeletal: Negative for falls and myalgias.  Skin: Negative for itching and rash.  Neurological: Negative for focal weakness, focal sensory changes and loss of consciousness.  Endo/Heme/Allergies: Does not bruise/bleed easily.     EKGs/Labs/Other Studies Reviewed:    The following studies were reviewed today: Echo 07/21/19  1. Severe hypokinesis of the left ventricular, mid-apical anteroseptal wall, inferoseptal wall, anterior wall and anterolateral wall.  2. The left ventricle has severely reduced systolic function, with an ejection fraction of 25-30%. The cavity size was normal. Left ventricular diastolic Doppler parameters are consistent with impaired relaxation.  3. The right ventricle has normal systolic function. The cavity was normal. There is no increase in right ventricular wall thickness. Right ventricular systolic pressure is mildly elevated with an estimated pressure of 30.7 mmHg.  4. Left atrial size was mildly dilated.  5. The mitral valve is degenerative. Mild thickening of the mitral valve leaflet.  6. The aortic valve is tricuspid. Moderate thickening of the aortic valve. Moderate calcification of the aortic valve. Aortic valve regurgitation is trivial by color  flow Doppler.  7. The aorta is normal unless otherwise noted.  8. No intracardiac thrombi or masses were visualized.  EKG:  EKG is personally reviewed.  The ekg ordered today demonstrates NSR, iLBBB with nonspecific ST changes  Recent Labs: 06/02/2019: ALT 16; BUN 13; Creatinine, Ser 1.22; Hemoglobin 14.2; Platelets 299; Potassium 4.3; Sodium 141  Recent Lipid Panel    Component Value Date/Time   CHOL 231 (H) 06/02/2019 1232   TRIG 81 06/02/2019 1232   HDL 63 06/02/2019 1232   CHOLHDL 3.7 06/02/2019 1232   LDLCALC 152 (H) 06/02/2019 1232    Physical Exam:    VS:  BP (!) 147/81   Pulse 62   Temp (!) 97.2 F (36.2 C)   Ht 5\' 11"  (1.803 m)   Wt 120 lb 3.2 oz (54.5 kg)   SpO2 100%   BMI 16.76 kg/m     Wt Readings from Last 3 Encounters:  08/24/19 120 lb 3.2 oz (54.5 kg)  10/22/15 140 lb (63.5 kg)    GEN: Well nourished, well developed in no acute distress HEENT: Normal, moist mucous membranes NECK: No JVD CARDIAC: regular rhythm, normal S1 and S2, no murmurs, rubs, gallops.  VASCULAR: Radial and DP pulses 2+ bilaterally. No carotid bruits RESPIRATORY:  Clear to auscultation without rales, wheezing or rhonchi  ABDOMEN: Soft, non-tender, non-distended MUSCULOSKELETAL:  Ambulates independently SKIN: Warm and dry, no edema NEUROLOGIC:  Alert and oriented x 3. No focal neuro deficits noted. PSYCHIATRIC:  Normal affect    ASSESSMENT:    1. Ischemic cardiomyopathy   2. Essential hypertension   3. Medication management   4. Coronary artery disease involving native coronary artery of native heart without angina pectoris   5. Chronic combined systolic and diastolic heart failure (HCC)   6. Tobacco abuse   7. Tobacco abuse counseling   8. History of CVA (cerebrovascular accident)    PLAN:    Ischemic cardiomyopathy, with chronic systolic and diastolic heart failure -continue carvedilol 12.5 mg BID -on lisinopril, will increase today to 20 mg dose -appears euvolemic  -counseled on daily weights, salt, fluid recommendations  CAD with prior PCI: denies chest pain -continue aspirin 81 mg. Has been on clopidogrel long term, no bleeding issues, wish to continue -continue atorvastatin 40 mg -continue carvedilol 12.5 mg BID -has history of CVA, reports ischemic, medical therapy as above  Hypercholesterolemia: goal LDL <70 -continue atorvastatin 40 mg  Hypertension: not at goal today -will increase lisinopril back to prior dose, 20 mg daily -continue carvedilol as above  Tobacco abuse: The patient was counseled on tobacco cessation today for 5 minutes.  Counseling included reviewing the risks of smoking tobacco products, how it impacts the patient's current medical diagnoses and different strategies for quitting.  Pharmacotherapy to aid in tobacco cessation was not prescribed today. Has gum already prescribed  Cardiac risk counseling and prevention recommendations: -recommend heart healthy/Mediterranean diet, with whole grains, fruits, vegetable, fish, lean meats, nuts, and olive oil. Limit salt. -recommend moderate walking, 3-5 times/week for 30-50 minutes each session. Aim for at least 150 minutes.week. Goal should be pace of 3 miles/hours, or walking 1.5 miles in 30 minutes -recommend avoidance of tobacco products. Avoid excess alcohol.  Plan for follow up: 3 mos  Medication Adjustments/Labs and Tests Ordered: Current medicines are reviewed at length with the patient today.  Concerns regarding medicines are outlined above.  Orders Placed This Encounter  Procedures  . Basic metabolic panel  . EKG 12-Lead   Meds ordered this encounter  Medications  . lisinopril (ZESTRIL) 20 MG tablet    Sig: Take 1 tablet (20 mg total) by mouth daily.    Dispense:  30 tablet    Refill:  11    Patient Instructions  Medication Instructions:  Increase: Lisinopril to 20 mg daily  If you need a refill on your cardiac medications before your next appointment,  please call your pharmacy.   Lab work: Your physician recommends that you return for lab work in 2 weeks (BMP)  If you have labs (blood work) drawn today and your tests are completely normal, you will receive your results only by: [name] MyChart Message (if you have MyChart) OR . A paper copy in the mail If you have any lab test that is abnormal or we need to change your treatment, we will call you to review the results.  Testing/Procedures: None  Follow-Up: At [facility], you and your health needs are our priority.  As part of  our continuing mission to provide you with exceptional heart care, we have created designated Provider Care Teams.  These Care Teams include your primary Cardiologist (physician) and Advanced Practice Providers (APPs -  Physician Assistants and Nurse Practitioners) who all work together to provide you with the care you need, when you need it. You will need a follow up appointment in 3 months.  Please call our office 2 months in advance to schedule this appointment.  You may see Dr. Cristal Deer or one of the following Advanced Practice Providers on your designated Care Team:   Theodore Demark, PA-C . Joni Reining, DNP, ANP  Your physician recommends that you schedule a follow-up appointment in 2 weeks with Pharm D.   Do the following things EVERY DAY:  1) Weigh yourself EVERY morning after you go to the bathroom but before you eat or drink anything. Write this number down in a weight log/diary. If you gain 3 pounds overnight or 5 pounds in a week, call the office.  2) Take your medicines as prescribed. If you have concerns about your medications, please call us before you stop taking them.   3) Eat low salt foods-Limit salt (sodium) to 2000 mg per day. This will help prevent your body from holding onto fluid. Read food labels as many processed foods have a lot of sodium, especially canned goods and prepackaged meats. If you would like some assistance choosing low  sodium foods, we would be happy to set you up with a nutritionist.  4) Stay as active as you can everyday. Staying active will give you more energy and make your muscles stronger. Start with 5 minutes at a time and work your way up to 30 minutes a day. Break up your activities--do some in the morning and some in the afternoon. Start with 3 days per week and work your way up to 5 days as you can.  If you have chest pain, feel short of breath, dizzy, or lightheaded, STOP. If you don't feel better after a short rest, call 911. If you do feel better, call the office to let us know you have symptoms with exercise.  5) Limit all fluids for the day to less than 2 liters. Fluid includes all drinks, coffee, juice, ice chips, soup, jello, and all other liquids.    Signed, Jodelle Red, MD PhD 08/24/2019 11:00 AM    [facility]

## 2019-08-29 ENCOUNTER — Encounter: Payer: Self-pay | Admitting: Physical Therapy

## 2019-08-29 ENCOUNTER — Other Ambulatory Visit: Payer: Self-pay

## 2019-08-29 ENCOUNTER — Ambulatory Visit: Payer: Medicaid Other | Admitting: Occupational Therapy

## 2019-08-29 ENCOUNTER — Ambulatory Visit: Payer: Medicaid Other | Attending: Internal Medicine | Admitting: Physical Therapy

## 2019-08-29 DIAGNOSIS — R2681 Unsteadiness on feet: Secondary | ICD-10-CM

## 2019-08-29 DIAGNOSIS — R262 Difficulty in walking, not elsewhere classified: Secondary | ICD-10-CM

## 2019-08-29 DIAGNOSIS — I69351 Hemiplegia and hemiparesis following cerebral infarction affecting right dominant side: Secondary | ICD-10-CM | POA: Diagnosis present

## 2019-08-29 NOTE — Therapy (Signed)
[facility] [address] [place], [place], [address] Phone: [phone]   Fax:  [phone]  Occupational Therapy Treatment  Patient Details  Name: Francisco Obrien MRN: [id number] Date of Birth: 1955/12/06 Referring Provider (OT): Dr. Jonah Blue   Encounter Date: 08/29/2019  OT End of Session - 08/29/19 1257    Visit Number  3    Number of Visits  11    Date for OT Re-Evaluation  10/02/19    Authorization Type  MCD    Authorization Time Period  Approved 3 visits from 8/17 - 07/23/19, Approved 9 visits from 10/13 - 10/02/19    Authorization - Visit Number  1    Authorization - Number of Visits  9    OT Start Time  1015    OT Stop Time  1100    OT Time Calculation (min)  45 min    Activity Tolerance  Patient tolerated treatment well    Behavior During Therapy  [facility] for tasks assessed/performed       Past Medical History:  Diagnosis Date  . CHF (congestive heart failure) (HCC)   . Coronary artery disease   . Stroke [facility])     No past surgical history on file.  There were no vitals filed for this visit.  Subjective Assessment - 08/29/19 1022    Subjective   We haven't seen you in 2 months. The splint is doing fine    Patient is accompanied by:  Family member   son   Pertinent History  PMH: CVA 2016 w/ residual Rt hemiparesis, CVA 2013 w/ no residual deficits, CAD (w/ 2 stents), CHF, HTN    Limitations  fall risk, stents, aphasic, no driving    Patient Stated Goals  more mobility    Currently in Pain?  No/denies        Pt issued HEP for neuro re-educ and maintaining joint integrity and P/ROM RUE - see pt instructions for details. Pt's son instructed that he would need to support RUE and Rt hand for wt bearing ex's - son verbalized understanding.   Practiced use of rolling walker w/ walker splint per PTA request - pt only able to perform w/ support given to Rt hand and assist for foot clearance d/t spasticity  and synergy pattern of RUE affecting LE.                    OT Education - 08/29/19 1100    Education Details  HEP    Person(s) Educated  Patient;Child(ren)    Methods  Explanation;Demonstration;Handout    Comprehension  Verbalized understanding;Returned demonstration          OT Long Term Goals - 08/29/19 1259      OT LONG TERM GOAL #1   Title  Pt/family Independent with splint wear and care    Baseline  dependent/not yet issued    Status  Achieved      OT LONG TERM GOAL #2   Title  Pt/family independent with HEP for RUE    Baseline  Dependent/not issued yet    Status  On-going      OT LONG TERM GOAL #3   Title  Pt/family will verbalize understanding w/ safety considerations due to lack of sensation Rt side    Baseline  dependent    Status  New      OT LONG TERM GOAL #4   Title  Pt/family will verbalize understanding w/ potential A/E and DME  needs to increase ease, safety, and independence with ADLS    Baseline  Dependent    Status  New      OT LONG TERM GOAL #5   Title  Pt will consistently be only direct supervision for dressing, and min assist for bathing (after set up)    Baseline  min assist for dressing, mod assist for bathing    Status  New      OT LONG TERM GOAL #6   Title  Pt will perform toilet transfer using 3-in-1 commode w/ min assist for clothes management    Baseline  dependent - son performing (using diapers)    Status  New            Plan - 08/29/19 1259    Clinical Impression Statement  Pt has not been seen in 2 months and returns today to continue current POC w/ no changes in status.    Occupational performance deficits (Please refer to evaluation for details):  ADL's    Body Structure / Function / Physical Skills  ADL;IADL;Sensation;Improper spinal/pelvic alignment;Strength;Muscle spasms;UE functional use;Pain;Proprioception;Decreased knowledge of use of DME;ROM;Mobility    Rehab Potential  Good    OT Frequency  1x / week    per pt request   OT Duration  --   9 weeks   OT Treatment/Interventions  Self-care/ADL training;Therapeutic exercise;Functional Mobility Training;Neuromuscular education;Manual Therapy;Splinting;Therapeutic activities;DME and/or AE instruction;Moist Heat;Passive range of motion;Patient/family education    Plan  review HEP, progress towards remaining goals    Consulted and Agree with Plan of Care  Patient;Family member/caregiver    Family Member Consulted  son       Patient will benefit from skilled therapeutic intervention in order to improve the following deficits and impairments:   Body Structure / Function / Physical Skills: ADL, IADL, Sensation, Improper spinal/pelvic alignment, Strength, Muscle spasms, UE functional use, Pain, Proprioception, Decreased knowledge of use of DME, ROM, Mobility       Visit Diagnosis: Hemiplegia and hemiparesis following cerebral infarction affecting right dominant side (HCC)  Unsteadiness on feet    Problem List Patient Active Problem List   Diagnosis Date Noted  . Influenza vaccine needed 07/21/2019  . Need for diphtheria-tetanus-pertussis (Tdap) vaccine 07/21/2019  . Ischemic cardiomyopathy 07/21/2019  . History of ischemic stroke without residual deficits 06/02/2019  . Essential hypertension 06/02/2019  . Hyperlipidemia 06/02/2019  . Coronary artery disease involving native coronary artery of native heart without angina pectoris 06/02/2019  . Tobacco dependence 06/02/2019  . Overgrown toenails 06/02/2019  . Bowel and bladder incontinence 06/02/2019  . Decreased breath sounds at right lung base 06/02/2019  . Homeless 06/02/2019  . Vitamin D deficiency 06/02/2019    Carey Bullocks, OTR/L 08/29/2019, 1:02 PM  [facility] [address] [place], [place], [address] Phone: [phone]   Fax:  [phone]  Name: Francisco Obrien MRN: [id number] Date of Birth: 09-17-1956

## 2019-08-29 NOTE — Patient Instructions (Signed)
  Flexion (Assistive)    Hold Rt wrist w/ Lt hand (thumb side up) and raise arms above head, keeping elbows as straight as possible. Can be done sitting or lying. Repeat _10___ times. Do __2__ sessions per day.  SELF ASSISTED: Elbow Flexion    Clasp hands together, bend both elbows. _10__ reps per set, _2__ sets per day  Flexion (Passive)    Sitting upright, slide forearm forward along table (folded towel underneath) with assist from other hand, bending from the waist until a stretch is felt.  Repeat _10___ times. Do __2__ sessions per day.  Then repeat side to side 10 reps, 2 sessions per day.   Lateral Weight Shift: Upper Trunk Leading   Sit with feet flat on floor. Bring ____ shoulder, head and arm toward side until forearm/elbow just touches sitting surface. Hold __10__ seconds. Return to upright position by pushing through arm Repeat _10___ times per session. Do __2__ sessions per day.   Forward Upper Body Weight Shift   Place both hands on a chair or stool in front. Lean body forward, then return to upright. Try to shift more weight onto involved arm. Repeat __10__ times. Do _2___ sessions per day.  [email or web address].[email or web address]   SITTING: Forward Weight Shift   Sit upright, place both hands on sitting surface. Lean chest forward, keep back straight. Hold _5__ seconds. _10__ reps per set, _2__ sets per day.  Place wedge under hand if wrist range of motion is limited.  [name]

## 2019-08-29 NOTE — Therapy (Signed)
[facility] [address] [place], [place], [address] Phone: [phone]   Fax:  [phone]  Physical Therapy Treatment  Patient Details  Name: Francisco Obrien MRN: [id number] Date of Birth: [birth year] Referring Provider (PT): Ladell Pier, MD   Encounter Date: 08/29/2019  PT End of Session - 08/29/19 0941    Visit Number  11    Number of Visits  18   eval plus 12 visits   Date for PT Re-Evaluation  10/24/19    Authorization Type  Medicaid - checking new authorization to continue    Authorization - Visit Number  10    Authorization - Number of Visits  12    PT Start Time  0933    PT Stop Time  1015    PT Time Calculation (min)  42 min    Equipment Utilized During Treatment  Gait belt    Activity Tolerance  Patient tolerated treatment well    Behavior During Therapy  [facility] for tasks assessed/performed       Past Medical History:  Diagnosis Date  . CHF (congestive heart failure) ([place])   . Coronary artery disease   . Stroke [facility])     History reviewed. No pertinent surgical history.  There were no vitals filed for this visit.  Subjective Assessment - 08/29/19 0937    Subjective  Has brace, reports it does help with walking. Has had it since the 7th of October.    Pertinent History  2016 CVA with Rt hemiparesis    Limitations  Standing    Currently in Pain?  No/denies            [facility] Adult PT Treatment/Exercise - 08/29/19 0952      Transfers   Transfers  Sit to Stand;Stand to Sit    Sit to Stand  5: Supervision;With upper extremity assist;From bed    Sit to Stand Details  Verbal cues for sequencing;Verbal cues for safe use of DME/AE;Manual facilitation for weight shifting;Manual facilitation for placement;Manual facilitation for weight bearing    Sit to Stand Details (indicate cue type and reason)  cues/assist/facilitatin for equal LE weight bearing with standing    Stand to Sit  5:  Supervision;With upper extremity assist;To bed;To chair/3-in-1    Stand to Sit Details (indicate cue type and reason)  Verbal cues for precautions/safety;Manual facilitation for weight shifting;Manual facilitation for placement;Manual facilitation for weight bearing    Stand to Sit Details  cues for use of bil LE's/UE for slow, controlled descent with sitting down       Ambulation/Gait   Ambulation/Gait  Yes    Ambulation/Gait Assistance  4: Min guard;4: Min assist;3: Mod assist    Ambulation/Gait Assistance Details  1st lap with hemiwalker with min guard assist with pt ambulating his way, provided min assist to facilitate increased right weight shifting with cues for increased stance time and increased left step length. After performing neuro re-ed activities had pt ambulate a second lap with left hand on PTA shoulder with cues to "step to my feet", and "follow me". with min/mod assist for balance and facilitation pt able to demonstrate a more reciprocal gait pattern with equalized stance time/step length. occasional scissoring noted, with cues and increased time pt was able to correct step placement.                       Ambulation Distance (Feet)  115 Feet   x2   Assistive device  Hemi-walker    Gait Pattern  Step-to pattern;Step-through pattern;Decreased arm swing - left;Decreased step length - right;Decreased step length - left;Decreased stance time - right;Decreased stride length;Decreased trunk rotation;Narrow base of support      Self-Care   Self-Care  Other Self-Care Comments    Other Self-Care Comments   brace education- monitor skin for bruising/rubbing. Use of long socks under brace to protect skin. Son assists pt with donning/doffing. skin checked with no issues noted.       Neuro Re-ed    Neuro Re-ed Details   for balance/muscle re-ed: seated at edge of mat with left foot on 4 inch box for sit<>stands with left UE on left knee, cues for anterior weight shifting -rocking use to  facitate this and for full upright standing with each rep; then in standing- right stance with left foot stepping fwd/bwd over half foam roller for 10 reps with emphasis on right stance,/weight shifting and increased left stepping length.           PT Short Term Goals - 08/02/19 0911      PT SHORT TERM GOAL #1   Title  Pt will be I and compliant with HEP. (6 weeks 08/02/19)    Baseline  now met    Status  Achieved      PT SHORT TERM GOAL #2   Title  Pt will decrease 5TSTS time to less than 30 sec    Baseline  35 sec on eval, 22 sec today    Status  Achieved        PT Long Term Goals - 08/13/19 2030      PT LONG TERM GOAL #1   Title  Pt will decrease 5TSTS test to less than 20 sec. to demonstrate improved LE strength. (Target for all LTG's 10/24/19 )    Baseline  currently 22 seconds on 08/02/19    Time  8    Period  --   visits   Status  New      PT LONG TERM GOAL #2   Title  Patient and pt's son will be independent with final HEP in order to build upon functional gains made in therapy.    Baseline  currently needs moderate cues for HEP    Time  8   visits   Status  New      PT LONG TERM GOAL #3   Title  Patient will perform stand pivot transfer with R AFO and hemi walker vs. no AD with mod I in order to decrease care giver burden.    Baseline  supervision with hemiwalker for transfer - verbal cues for foot placement    Time  8   visits   Status  New      PT LONG TERM GOAL #4   Title  Pt will ambulate 230 ft (2 laps in clinic) with supervision and hemiwalker with R AFO in order to improve household mobility.    Baseline  now 125 ft with hemiwalker and R AFO    Time  8    Period  --   visits   Status  New      PT LONG TERM GOAL #5   Title  Pt will ambulate 10 feet in 14 seconds or less to show improved gait speed.    Baseline  now 18 seconds on 08/02/19    Time  8    Period  --   visits   Status  New  Additional Long Term Goals   Additional Long Term  Goals  Yes      PT LONG TERM GOAL #6   Title  Patient will perform static standing at countertop with no vs. single UE support for at least 6 minutes with supervision in order to improve endurance for ADLs.    Baseline  not yet formally assessed    Time  8    Period  --   visits   Status  New            Plan - 08/29/19 0942    Clinical Impression Statement  Today's skilled session continued to focus on right stance/weight bearing with gait, right LE strengthening and improved fluidity with gait. The pt is making progress toward goals and should benefit from continued PT to progress toward unmet goals.    Personal Factors and Comorbidities  Comorbidity 1;Comorbidity 2;Comorbidity 3+    Comorbidities  Patient with history of CHF, HTN, CAD (2 stents in 2013 and 2003), CVA  (09/2016) with right hemiparesis, incont of urine and bowel,  tob depen, vit D def    Examination-Activity Limitations  Bathing;Locomotion Level;Transfers;Stairs;Stand;Bed Mobility    Examination-Participation Restrictions  Cleaning;Community Activity;Shop;Laundry    Stability/Clinical Decision Making  Evolving/Moderate complexity    Rehab Potential  Good    PT Frequency  1x / week   1-2   PT Duration  8 weeks    PT Treatment/Interventions  ADLs/Self Care Home Management;DME Instruction;Gait training;Stair training;Functional mobility training;Therapeutic activities;Therapeutic exercise;Balance training;Neuromuscular re-education;Manual techniques;Passive range of motion    PT Next Visit Plan  Review HEP. Gait and transfer training with AFO.    PT Home Exercise Plan  [email or web address]    Consulted and Agree with Plan of Care  Patient;Family member/caregiver    Family Member Consulted  son       Patient will benefit from skilled therapeutic intervention in order to improve the following deficits and impairments:  Abnormal gait, Decreased activity tolerance, Decreased balance, Decreased mobility, Decreased endurance,  Decreased range of motion, Decreased strength, Difficulty walking, Obesity  Visit Diagnosis: Unsteadiness on feet  Hemiplegia and hemiparesis following cerebral infarction affecting right dominant side (HCC)  Difficulty in walking, not elsewhere classified     Problem List Patient Active Problem List   Diagnosis Date Noted  . Influenza vaccine needed 07/21/2019  . Need for diphtheria-tetanus-pertussis (Tdap) vaccine 07/21/2019  . Ischemic cardiomyopathy 07/21/2019  . History of ischemic stroke without residual deficits 06/02/2019  . Essential hypertension 06/02/2019  . Hyperlipidemia 06/02/2019  . Coronary artery disease involving native coronary artery of native heart without angina pectoris 06/02/2019  . Tobacco dependence 06/02/2019  . Overgrown toenails 06/02/2019  . Bowel and bladder incontinence 06/02/2019  . Decreased breath sounds at right lung base 06/02/2019  . Homeless 06/02/2019  . Vitamin D deficiency 06/02/2019    Willow Ora, PTA, [facility] [address], [place], [place] [address] [phone] 08/29/19, 12:08 PM   Name: Turki Tapanes MRN: [id number] Date of Birth: 1956-02-21

## 2019-09-04 ENCOUNTER — Ambulatory Visit: Payer: Medicaid Other

## 2019-09-05 ENCOUNTER — Ambulatory Visit: Payer: Medicaid Other | Admitting: Physical Therapy

## 2019-09-05 LAB — BASIC METABOLIC PANEL
BUN/Creatinine Ratio: 13 (ref 10–24)
BUN: 13 mg/dL (ref 8–27)
CO2: 24 mmol/L (ref 20–29)
Calcium: 9.9 mg/dL (ref 8.6–10.2)
Chloride: 106 mmol/L (ref 96–106)
Creatinine, Ser: 1.02 mg/dL (ref 0.76–1.27)
GFR calc Af Amer: 91 mL/min/{1.73_m2} (ref >59)
GFR calc non Af Amer: 79 mL/min/{1.73_m2} (ref >59)
Glucose: 73 mg/dL (ref 65–99)
Potassium: 4.6 mmol/L (ref 3.5–5.2)
Sodium: 145 mmol/L — ABNORMAL HIGH (ref 134–144)

## 2019-09-07 ENCOUNTER — Encounter: Payer: Self-pay | Admitting: Cardiology

## 2019-09-07 DIAGNOSIS — Z720 Tobacco use: Secondary | ICD-10-CM | POA: Insufficient documentation

## 2019-09-07 DIAGNOSIS — Z8673 Personal history of transient ischemic attack (TIA), and cerebral infarction without residual deficits: Secondary | ICD-10-CM | POA: Insufficient documentation

## 2019-09-07 DIAGNOSIS — I5042 Chronic combined systolic (congestive) and diastolic (congestive) heart failure: Secondary | ICD-10-CM | POA: Insufficient documentation

## 2019-09-11 ENCOUNTER — Ambulatory Visit: Payer: Medicaid Other | Admitting: Podiatry

## 2019-09-12 ENCOUNTER — Ambulatory Visit: Payer: Medicaid Other | Admitting: Physical Therapy

## 2019-09-13 MED FILL — BACLOFEN 10 MG TABLET: 10 | 90 days supply | Qty: 270 | Fill #1

## 2019-09-13 MED FILL — ATORVASTATIN CALCIUM 40 MG: 40 | 90 days supply | Qty: 90 | Fill #1

## 2019-09-13 MED FILL — CARVEDILOL 12.5 MG TABLET: 12.5 | 90 days supply | Qty: 180 | Fill #1

## 2019-09-13 MED FILL — LISINOPRIL 10 MG TABS: 10 | 90 days supply | Qty: 90 | Fill #1

## 2019-09-14 ENCOUNTER — Ambulatory Visit: Payer: Medicaid Other

## 2019-09-14 ENCOUNTER — Telehealth: Payer: Self-pay

## 2019-09-14 NOTE — Progress Notes (Deleted)
     09/14/2019 Francisco Obrien September 30, 1956 [id number]   HPI:  Francisco Obrien is a 63 y.o. male patient of Dr Harrell Gave, with a [place] below who presents today for hypertension clinic evaluation.  Past Medical History: hyperlipidemia (7/20) TC 231, TG 81, HDL 63, LDL 419  CAD   Systolic/diastolic CHF (3/79) EF 02-40%,  Severe LV hypokinesis  Tobacco dependence Has Nicorette gum  Prior CVA      Blood Pressure Goal:  130/80  Current Medications: amlodipine 10 mg qd, atorvastatin 40 mg qd, carvedilol 12.5 mg bid, clopidogrel 75 mg qd, lisinopril 20 mg qd  Family Hx:  Social Hx:  Diet:  Exercise:  Home BP readings:  Intolerances:   Labs:  Wt Readings from Last 3 Encounters:  08/24/19 120 lb 3.2 oz (54.5 kg)  10/22/15 140 lb (63.5 kg)   BP Readings from Last 3 Encounters:  08/24/19 (!) 147/81  07/18/19 (!) 142/93  07/12/19 133/84   Pulse Readings from Last 3 Encounters:  08/24/19 62  07/18/19 69  06/09/19 61    Current Outpatient Medications  Medication Sig Dispense Refill  . albuterol (VENTOLIN HFA) 108 (90 Base) MCG/ACT inhaler Inhale 2 puffs into the lungs every 6 (six) hours as needed for wheezing or shortness of breath. 18 g 6  . amLODipine (NORVASC) 10 MG tablet Take 1 tablet (10 mg total) by mouth daily. 30 tablet 6  . aspirin 81 MG tablet Take 1 tablet (81 mg total) by mouth daily. 20 tablet 0  . atorvastatin (LIPITOR) 40 MG tablet Take 1 tablet (40 mg total) by mouth at bedtime. 30 tablet 6  . baclofen (LIORESAL) 10 MG tablet Take 1 tablet (10 mg total) by mouth 3 (three) times daily. 90 each 5  . carvedilol (COREG) 12.5 MG tablet Take 1 tablet (12.5 mg total) by mouth 2 (two) times daily with a meal. 60 tablet 5  . clopidogrel (PLAVIX) 75 MG tablet Take 1 tablet (75 mg total) by mouth daily. 30 tablet 6  . ergocalciferol (VITAMIN D2) 1.25 MG (50000 UT) capsule Take 1 capsule (50,000 Units total) by mouth once a week. (Patient not taking: Reported on 08/24/2019)  12 capsule 1  . folic acid (FOLVITE) 1 MG tablet Take 1 mg by mouth daily.    [name] lisinopril (ZESTRIL) 20 MG tablet Take 1 tablet (20 mg total) by mouth daily. 30 tablet 11  . Misc. Devices MISC Adult sized diaper-large.  Dispense 1 box with refills. Dx: bowel and bladder incontinence 1 each 6  . nicotine polacrilex (NICORETTE) 2 MG gum Take 1 each (2 mg total) by mouth as needed for smoking cessation. 100 tablet 0   No current facility-administered medications for this visit.     No Known Allergies  Past Medical History:  Diagnosis Date  . CHF (congestive heart failure) ([place])   . Coronary artery disease   . Stroke [facility])     There were no vitals taken for this visit.  No problem-specific Assessment & Plan notes found for this encounter.   Tommy Medal PharmD CPP [facility] [address] [place], [place] [address] [phone]

## 2019-09-14 NOTE — Telephone Encounter (Signed)
lmomed the pt to call back to reschedule 

## 2019-09-19 ENCOUNTER — Ambulatory Visit: Payer: Medicaid Other | Admitting: Physical Therapy

## 2019-09-22 ENCOUNTER — Telehealth: Payer: Self-pay | Admitting: Physical Therapy

## 2019-09-22 NOTE — Telephone Encounter (Signed)
Called patient due to past 2 no show PT appointments. Spoke to pt's son Duane Boston. He reported that they have re-located to [place] and will need to cancel all remaining PT appointments.   Janann August, PT, DPT 09/22/19 12:28 PM

## 2019-09-25 ENCOUNTER — Encounter: Payer: Self-pay | Admitting: Physical Therapy

## 2019-09-25 NOTE — Therapy (Signed)
[facility] [address] [place], [place], [address] Phone: [phone]   Fax:  [phone]  Patient Details  Name: Francisco Obrien MRN: [id number] Date of Birth: [birth year] Referring Provider:  No ref. provider found  Encounter Date: 09/25/2019   Called pt's son on 09/22/19 - stating that they have re-located to [place].   PHYSICAL THERAPY DISCHARGE SUMMARY  Visits from Start of Care: 11  Current functional level related to goals / functional outcomes: Unable to assess due to patient no-showed past couple of visits.   Remaining deficits: Impaired gait, impaired balance, decreased LE strength.    Education / Equipment: HEP  Plan: Patient agrees to discharge.  Patient goals were not met. Patient is being discharged due to not returning since the last visit.  ?????       Arliss Journey, PT, DPT  09/25/2019, 8:34 AM  [facility] [address] [place], [place], [address] Phone: [phone]   Fax:  [phone]

## 2019-09-26 ENCOUNTER — Ambulatory Visit: Payer: Medicaid Other | Admitting: Physical Therapy

## 2019-10-02 ENCOUNTER — Telehealth: Payer: Self-pay | Admitting: Internal Medicine

## 2019-10-02 NOTE — Telephone Encounter (Signed)
Correction - patient will need to have that pharmacy contact us to request transfer.

## 2019-10-02 NOTE — Telephone Encounter (Signed)
Patient needs to contact our pharmacy to transfer.

## 2019-10-02 NOTE — Telephone Encounter (Signed)
Patient called requesting for all his medications be transferred to [facility] on  [address] [address], [place], [place]. Please f/u

## 2019-10-03 ENCOUNTER — Ambulatory Visit: Payer: Medicaid Other | Admitting: Physical Therapy

## 2019-10-03 ENCOUNTER — Other Ambulatory Visit: Payer: Self-pay

## 2019-10-03 DIAGNOSIS — I10 Essential (primary) hypertension: Secondary | ICD-10-CM

## 2019-10-03 DIAGNOSIS — F17200 Nicotine dependence, unspecified, uncomplicated: Secondary | ICD-10-CM

## 2019-10-03 DIAGNOSIS — Z8673 Personal history of transient ischemic attack (TIA), and cerebral infarction without residual deficits: Secondary | ICD-10-CM

## 2019-10-03 DIAGNOSIS — I2510 Atherosclerotic heart disease of native coronary artery without angina pectoris: Secondary | ICD-10-CM

## 2019-10-03 MED ORDER — ATORVASTATIN CALCIUM 40 MG PO TABS: 40.0000 mg | ORAL_TABLET | Freq: Every day | ORAL | 2 refills | Status: AC

## 2019-10-03 MED ORDER — BACLOFEN 10 MG PO TABS: 10.0000 mg | ORAL_TABLET | Freq: Three times a day (TID) | ORAL | 2 refills | Status: AC

## 2019-10-03 MED ORDER — NICOTINE POLACRILEX 2 MG MT GUM: 2.0000 mg | CHEWING_GUM | OROMUCOSAL | 0 refills | Status: AC | PRN

## 2019-10-03 MED ORDER — CARVEDILOL 12.5 MG PO TABS: 12.5000 mg | ORAL_TABLET | Freq: Two times a day (BID) | ORAL | 2 refills | Status: AC

## 2019-10-03 MED ORDER — LISINOPRIL 20 MG PO TABS: 20.0000 mg | ORAL_TABLET | Freq: Every day | ORAL | 2 refills | Status: AC

## 2019-10-03 MED ORDER — AMLODIPINE BESYLATE 10 MG PO TABS: 10.0000 mg | ORAL_TABLET | Freq: Every day | ORAL | 2 refills | Status: AC

## 2019-10-03 MED ORDER — CLOPIDOGREL BISULFATE 75 MG PO TABS: 75.0000 mg | ORAL_TABLET | Freq: Every day | ORAL | 2 refills | Status: AC

## 2019-10-03 NOTE — Telephone Encounter (Signed)
Contacted pt son to confirm appointment for 11/19 pt son states they will not be able to make it due to being out of town  Pt son is requesting that rxs be sent to [facility] on  [place], [place], [place]

## 2019-10-05 ENCOUNTER — Ambulatory Visit: Payer: Medicaid Other | Admitting: Internal Medicine

## 2019-10-09 ENCOUNTER — Ambulatory Visit: Payer: Medicaid Other | Admitting: Physical Therapy

## 2019-10-16 ENCOUNTER — Ambulatory Visit: Payer: Medicaid Other | Admitting: Physical Therapy

## 2019-11-21 ENCOUNTER — Telehealth: Payer: Self-pay | Admitting: *Deleted

## 2019-11-21 NOTE — Telephone Encounter (Signed)
I spoke with Mr.Allington's son and he stated they have moved to [facility].

## 2020-09-03 IMAGING — CR DG CHEST 2V
1 series · 1 of 1 positions shown · non-contrast
Comparison: 10/22/2015

CLINICAL DATA: Decreased breath sounds

EXAM:
CHEST - 2 VIEW

[chest ap]
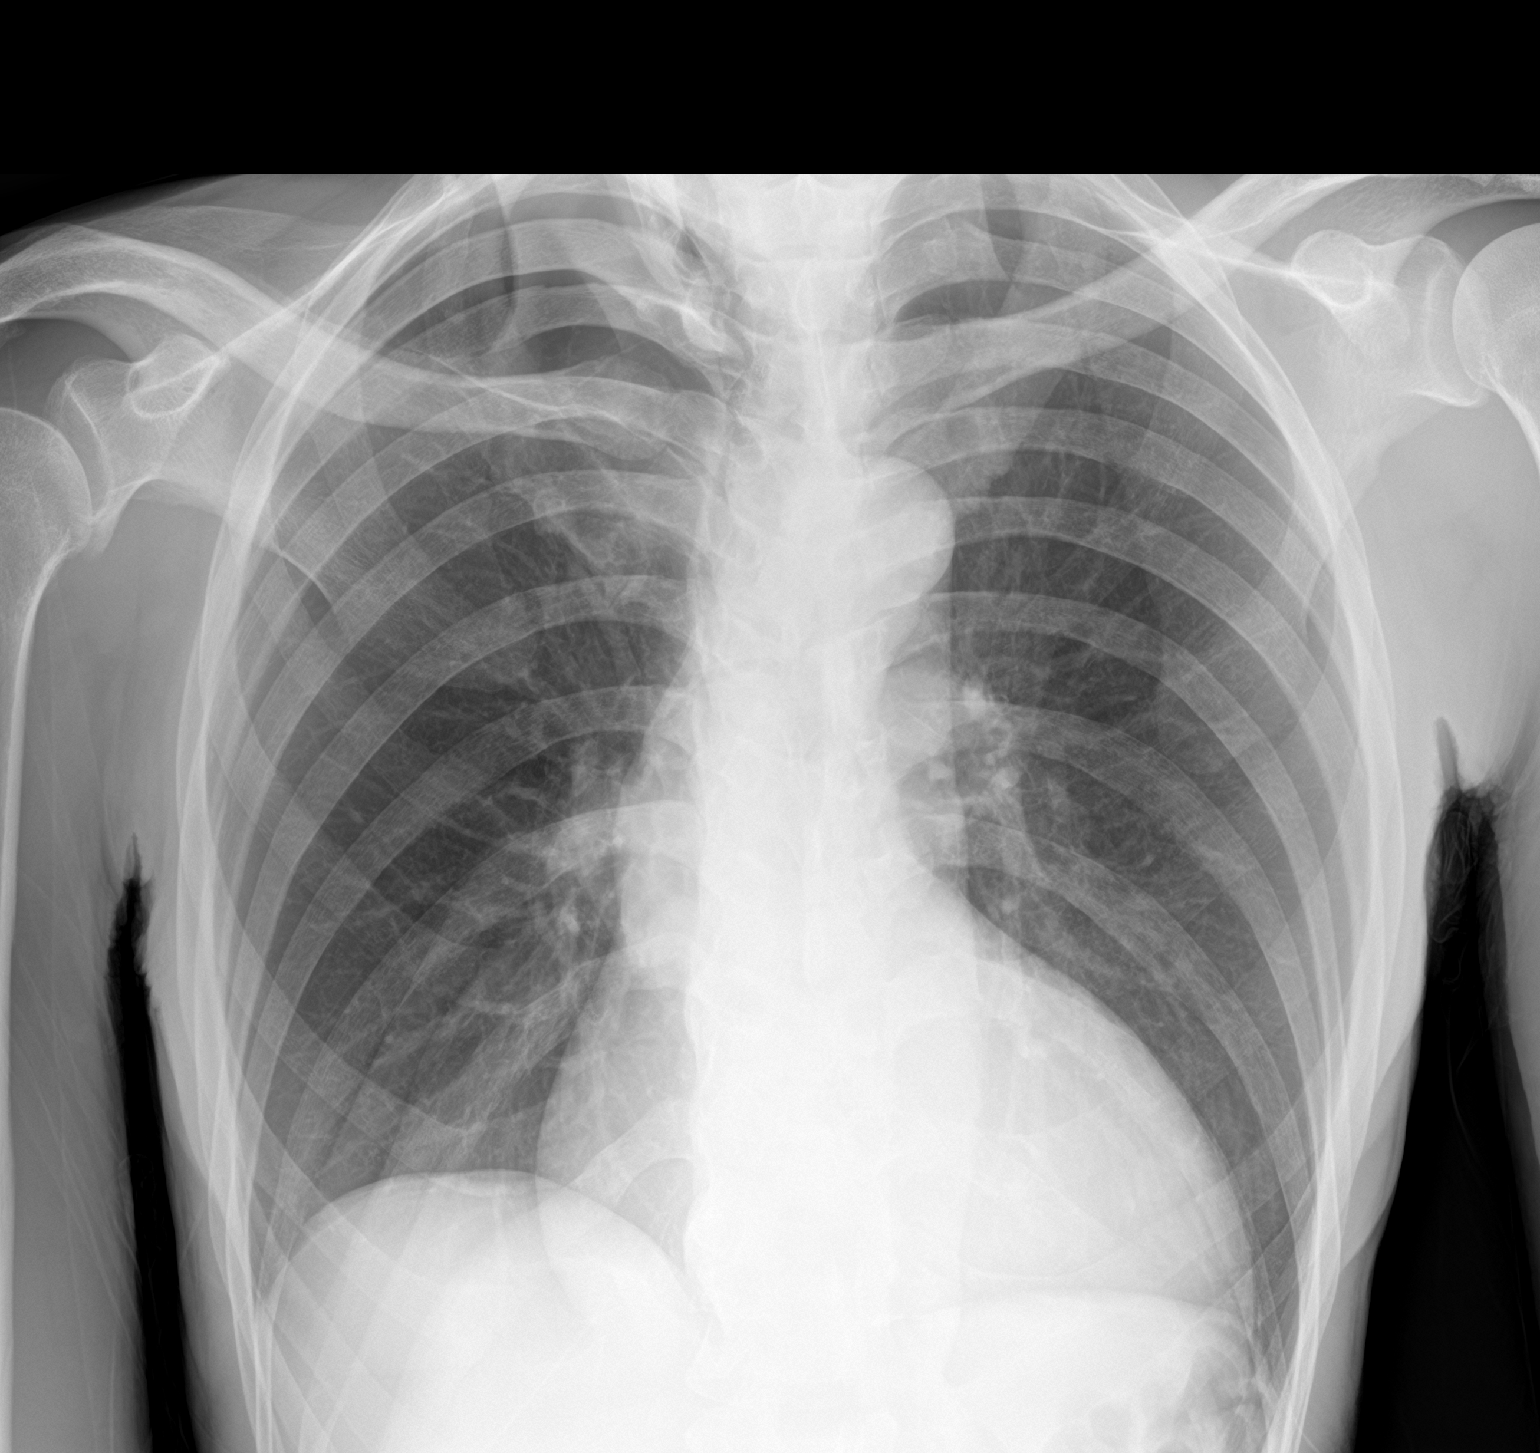

[1 of 1 positions shown; findings below may reference images not displayed]

FINDINGS: Mild-to-moderate cardiomegaly. Tortuous aorta. Lungs are
hyperexpanded. No focal airspace consolidation. No pleural effusion
or pneumothorax. Osseous structures unremarkable.
IMPRESSION: 1. Mild-to-moderate cardiomegaly, progressed from prior. Underlying
pericardial effusion not excluded.
2. Lungs are clear.
